# Patient Record
Sex: Female | Born: 1955 | Hispanic: Yes | Marital: Single | Smoking: Never smoker
Health system: Southern US, Community
[De-identification: ages and names within clinical notes are randomized; demographics above are authoritative.]

## PROBLEM LIST (undated history)

## (undated) DIAGNOSIS — I10 Essential (primary) hypertension: Secondary | ICD-10-CM

## (undated) DIAGNOSIS — M549 Dorsalgia, unspecified: Secondary | ICD-10-CM

## (undated) DIAGNOSIS — E7800 Pure hypercholesterolemia, unspecified: Secondary | ICD-10-CM

## (undated) DIAGNOSIS — M1990 Unspecified osteoarthritis, unspecified site: Secondary | ICD-10-CM

## (undated) HISTORY — PX: OOPHORECTOMY: SHX86

## (undated) HISTORY — PX: ABDOMINAL HYSTERECTOMY: SHX81

---

## 2005-11-29 ENCOUNTER — Ambulatory Visit: Payer: Self-pay | Admitting: Family Medicine

## 2007-10-14 ENCOUNTER — Ambulatory Visit: Payer: Self-pay

## 2010-03-14 ENCOUNTER — Ambulatory Visit: Payer: Self-pay

## 2013-01-06 LAB — COMPREHENSIVE METABOLIC PANEL
Albumin: 3.4 g/dL (ref 3.4–5.0)
Alkaline Phosphatase: 162 U/L — ABNORMAL HIGH (ref 50–136)
BUN: 22 mg/dL — ABNORMAL HIGH (ref 7–18)
Bilirubin,Total: 0.2 mg/dL (ref 0.2–1.0)
Co2: 27 mmol/L (ref 21–32)
Creatinine: 0.93 mg/dL (ref 0.60–1.30)
EGFR (African American): >60
SGOT(AST): 39 U/L — ABNORMAL HIGH (ref 15–37)

## 2013-01-06 LAB — URINALYSIS, COMPLETE
Bacteria: NONE SEEN
Bilirubin,UR: NEGATIVE
Glucose,UR: NEGATIVE mg/dL (ref 0–75)
Nitrite: NEGATIVE
Ph: 7 (ref 4.5–8.0)
Protein: NEGATIVE

## 2013-01-06 LAB — TROPONIN I: Troponin-I: <0.02 ng/mL

## 2013-01-06 LAB — CBC
HCT: 37.4 % (ref 35.0–47.0)
HGB: 12.7 g/dL (ref 12.0–16.0)
MCH: 30.2 pg (ref 26.0–34.0)

## 2013-01-07 ENCOUNTER — Observation Stay: Payer: Self-pay | Admitting: Internal Medicine

## 2013-01-07 LAB — LIPID PANEL
Cholesterol: 183 mg/dL (ref 0–200)
Ldl Cholesterol, Calc: 103 mg/dL — ABNORMAL HIGH (ref 0–100)
Triglycerides: 144 mg/dL (ref 0–200)
VLDL Cholesterol, Calc: 29 mg/dL (ref 5–40)

## 2013-01-07 LAB — CK TOTAL AND CKMB (NOT AT ARMC)
CK, Total: 143 U/L (ref 21–215)
CK, Total: 122 U/L (ref 21–215)
CK, Total: 110 U/L (ref 21–215)
CK-MB: 1.9 ng/mL (ref 0.5–3.6)
CK-MB: 1.3 ng/mL (ref 0.5–3.6)

## 2013-01-07 LAB — TROPONIN I
Troponin-I: <0.02 ng/mL
Troponin-I: <0.02 ng/mL

## 2013-01-07 LAB — HEMOGLOBIN A1C: Hemoglobin A1C: 5.9 % (ref 4.2–6.3)

## 2013-01-08 LAB — CBC WITH DIFFERENTIAL/PLATELET
Basophil #: 0 10*3/uL (ref 0.0–0.1)
Eosinophil #: 0.3 10*3/uL (ref 0.0–0.7)
HGB: 12.5 g/dL (ref 12.0–16.0)
MCH: 30.4 pg (ref 26.0–34.0)
MCV: 90 fL (ref 80–100)
Monocyte #: 0.7 x10 3/mm (ref 0.2–0.9)
Monocyte %: 8.2 %
Neutrophil #: 5.4 10*3/uL (ref 1.4–6.5)
Neutrophil %: 59.6 %
Platelet: 245 10*3/uL (ref 150–440)
RBC: 4.1 10*6/uL (ref 3.80–5.20)
RDW: 13.5 % (ref 11.5–14.5)
WBC: 9.1 10*3/uL (ref 3.6–11.0)

## 2013-01-08 LAB — BASIC METABOLIC PANEL
Calcium, Total: 8.7 mg/dL (ref 8.5–10.1)
Chloride: 110 mmol/L — ABNORMAL HIGH (ref 98–107)
Co2: 27 mmol/L (ref 21–32)
EGFR (African American): >60
Osmolality: 285 (ref 275–301)

## 2013-10-17 ENCOUNTER — Emergency Department: Payer: Self-pay | Admitting: Emergency Medicine

## 2013-10-17 LAB — COMPREHENSIVE METABOLIC PANEL
Albumin: 3.4 g/dL (ref 3.4–5.0)
Alkaline Phosphatase: 196 U/L — ABNORMAL HIGH
Anion Gap: 6 — ABNORMAL LOW (ref 7–16)
BUN: 21 mg/dL — ABNORMAL HIGH (ref 7–18)
Bilirubin,Total: 0.2 mg/dL (ref 0.2–1.0)
CHLORIDE: 103 mmol/L (ref 98–107)
CO2: 28 mmol/L (ref 21–32)
CREATININE: 0.81 mg/dL (ref 0.60–1.30)
Calcium, Total: 9.5 mg/dL (ref 8.5–10.1)
EGFR (African American): >60
EGFR (Non-African Amer.): >60
Glucose: 111 mg/dL — ABNORMAL HIGH (ref 65–99)
Osmolality: 277 (ref 275–301)
Potassium: 3.5 mmol/L (ref 3.5–5.1)
SGOT(AST): 52 U/L — ABNORMAL HIGH (ref 15–37)
SGPT (ALT): 48 U/L (ref 12–78)
Sodium: 137 mmol/L (ref 136–145)
TOTAL PROTEIN: 8.5 g/dL — AB (ref 6.4–8.2)

## 2013-10-17 LAB — CBC
HCT: 38.8 % (ref 35.0–47.0)
HGB: 13 g/dL (ref 12.0–16.0)
MCH: 30 pg (ref 26.0–34.0)
MCHC: 33.6 g/dL (ref 32.0–36.0)
MCV: 89 fL (ref 80–100)
Platelet: 245 10*3/uL (ref 150–440)
RBC: 4.34 10*6/uL (ref 3.80–5.20)
RDW: 13.2 % (ref 11.5–14.5)
WBC: 10 10*3/uL (ref 3.6–11.0)

## 2013-10-17 LAB — PROTIME-INR
INR: 0.9
Prothrombin Time: 12.5 secs (ref 11.5–14.7)

## 2013-10-17 LAB — TROPONIN I

## 2013-10-17 LAB — CK TOTAL AND CKMB (NOT AT ARMC)
CK, TOTAL: 193 U/L (ref 21–215)
CK-MB: 3.4 ng/mL (ref 0.5–3.6)

## 2014-05-11 ENCOUNTER — Ambulatory Visit: Payer: Self-pay

## 2014-06-29 ENCOUNTER — Emergency Department: Payer: Self-pay | Admitting: Emergency Medicine

## 2014-08-06 ENCOUNTER — Emergency Department: Payer: Self-pay | Admitting: Emergency Medicine

## 2015-01-06 NOTE — H&P (Signed)
PATIENT NAME:  Carmen Soto, Carmen Soto MR#:  [id number] DATE OF BIRTH:  [birth year]  DATE OF ADMISSION:  01/07/2013  REFERRING PHYSICIAN:  Dr. Bayard Males.  PRIMARY CARE PHYSICIAN:   [facility].   CHIEF COMPLAINT:  Multiple complaints including dizziness, headache, left upper extremity pain and minimal weakness and nausea.   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, the patient had CT brain in ED which did not show any acute finding, as well physical exam does not show any focal deficits besides her facial palsy, the patient reports she has been having left upper extremity pain, in the shoulder and in the neck area exacerbated by movement, as well some altered sensation there, as well has been complaining of unsteady gait and difficulty walking, hospitalist service were requested to admit the patient for further management and work-up of her symptoms, the patient reports she has history of hypertension and hyperlipidemia which are pretty controlled on her oral medications, reports she has been following regularly with Jeralyn Ruths, the patient denies any altered mental status, confusion, denies any history of falls, has been complaining of nausea, but denies any vomiting, no fever or chills.   PAST MEDICAL HISTORY: 1.  Hyperlipidemia.  2.  Hypertension.   PAST SURGICAL HISTORY: 1.  C-section x 3.  2.  Hysterectomy.  3.  Cholecystectomy.   ALLERGIES:  No known drug allergies.   HOME MEDICATIONS: 1.  Lipitor  20 mg oral at bedtime.  2.  Metoprolol 25 mg oral daily.  3.  Reports she has been taking aspirin 325 mg oral daily for arthritic pain.   FAMILY HISTORY:  Significant for CVA in the family, and hypertension, coronary artery disease, no history of diabetes mellitus.   SOCIAL HISTORY:  Works in packing, denies any history of smoking, alcohol or illicit drug use.   REVIEW OF SYSTEMS:  CONSTITUTIONAL:  Denies fever, chills.  Complains of generalized weakness.  EYES:  Denies blurry vision, double vision, inflammation or glaucoma.  EARS, NOSE, THROAT:  Denies tinnitus, ear pain, hearing loss or epistaxis.  RESPIRATORY:  Denies cough, wheezing, hemoptysis, dyspnea or COPD.  CARDIOVASCULAR:  Complains of chest pain with her other complaints, but currently she denies any chest pain.  No edema.  No palpitation, no syncope.  GASTROINTESTINAL:  Complains of nausea.  Denies vomiting, diarrhea, abdominal pain, hematemesis, jaundice, rectal bleed, or hemorrhoids.  GENITOURINARY:  Denies dysuria, hematuria, renal colic.  ENDOCRINE:  Denies polyuria, polydipsia, heat or cold intolerance.  HEMATOLOGY:  Denies anemia, easy bruising, bleeding diathesis.  INTEGUMENTARY:  Denies acne, rash or skin lesions.  MUSCULOSKELETAL:  Complains of neck pain, shoulder pain and arthritis.  Denies any gout.  NEUROLOGIC:  Denies CVA, TIA, seizures, memory loss, ataxia.  Has complaints of dizziness and headache.  PSYCHIATRIC:  Denies anxiety, insomnia, schizophrenia, or bipolar disorder.   PHYSICAL EXAMINATION: VITAL SIGNS:  Temperature 97.9, pulse 68, respiratory rate 18, blood pressure 124/77, saturating 95% on room air.  GENERAL:  Well-nourished female who looks comfortable in bed  in no apparent distress.  HEENT:  Head atraumatic, normocephalic.  Pupils equal, reactive to light.  Pink conjunctivae.  Anicteric sclerae.  Moist oral mucosa.  NECK:  Supple.  No thyromegaly.  No JVD.  CHEST:  Good air entry bilaterally.  No  wheezing, rales, rhonchi.  CARDIOVASCULAR:  S1, S2 heard.  No rubs, murmurs, or gallops.  ABDOMEN:  Soft, nontender, nondistended.  Bowel sounds present.  EXTREMITIES:  No edema.  No clubbing.  No cyanosis.  SKIN:  Normal skin turgor.  Warm and dry.  MUSCULOSKELETAL:  Has neck tenderness to palpation.  No joint effusion noticed.  NEUROLOGICAL:  The patient has left side facial Bell's palsy which is chronic for the last 20 years, other than that, no cranial focal deficits noticed, upper extremity motor strength 5 out of 5, sensation 5 out of 5.  Lower extremity sensation 5 out of 5, motor 5 out of 5.  Cerebellar exam fine.  Negative Romberg sign.  Normal gait.  Negative pronator drift.  PSYCHIATRIC:  Appropriate affect. Awake, alert x 3.  Intact judgment and insight.   PERTINENT LABORATORY DATA:  Glucose 147, BUN 22, creatinine 0.93, sodium 140, potassium 3.7, chloride 107, CO2 27.  Troponin less than 0.02.  White blood cells 9.6, hemoglobin 12.7, hematocrit 37.4, platelets 269.  CT of the head, no evidence of acute intracranial abnormalities.   ASSESSMENT AND PLAN: 1.  Dizziness, left upper extremity pain, generalized weakness, the patient had multiple complaints, but it appears most of her complaints appears to be due to cervical radiculopathy, has been pronounced is in the left upper extremity pain mainly upon ambulation, but given the fact patient has significant headache and complaints of unsteady gait even though it was noticed in ED, it was not noticed during my physical exam, the patient will be admitted for further evaluation.  We will obtain MRI of the brain, we will obtain bilateral carotid Dopplers and we will have the patient on telemonitor, she will be given 325 of aspirin now and will be continued on 81 mg of aspirin daily as we will check her lipid panel.  2.  Hypertension.  We will continue her on metoprolol, currently is acceptable.  3.  Hyperlipidemia.  Continue with Lipitor.  4.   Chest pain appears to be atypical, musculoskeletal, as she had reproducible chest pain on palpation, but we will continue to cycle her cardiac enzymes.  5.  Deep vein thrombosis prophylaxis.  Subcutaneous heparin.  6.  CODE STATUS:  FULL CODE.   Total time spent on admission and patient care 55 minutes.    ____________________________ Starleen Armsawood S. Elgergawy, MD dse:ea D: 01/07/2013 01:49:39 ET T: 01/07/2013 02:44:34 ET JOB#: [id number]  cc: Starleen Armsawood S. Elgergawy, MD, <Dictator> DAWOOD Teena IraniS ELGERGAWY MD ELECTRONICALLY SIGNED 01/10/2013 0:20

## 2015-01-06 NOTE — H&P (Signed)
PATIENT NAME:  Carmen Soto, Carmen Soto MR#:  [id number] DATE OF BIRTH:  09/24/1955  DATE OF ADMISSION:  01/06/2013  ADDENDUM:   The patient is Spanish speaking, and there was a Engineer, structuralpanish translator present during the whole interview and physical exam. The Spanish translator was [place].   ____________________________ Starleen Armsawood S. Nayanna Seaborn, MD dse:gb D: 01/07/2013 01:52:44 ET T: 01/07/2013 02:05:25 ET JOB#: [id number]  cc: Starleen Armsawood S. Iylah Dworkin, MD, <Dictator> Miran Kautzman Teena IraniS Wynnie Pacetti MD ELECTRONICALLY SIGNED 01/10/2013 0:19

## 2015-01-06 NOTE — Discharge Summary (Signed)
PATIENT NAME:  Carmen Soto, Carmen Soto MR#:  [id number] DATE OF BIRTH:  10-Jun-1956  DATE OF ADMISSION:  01/07/2013  ADMITTING PHYSICIAN:  Dr. Randol KernElgergawy  DATE OF DISCHARGE:  01/08/2013  DISCHARGING PHYSICIAN:  Dr. Enid Baasadhika Chun Sellen  PRIMARY CARE PHYSICIAN: [facility]  CONSULTATIONS IN THE HOSPITAL:  None.   DISCHARGE DIAGNOSES: 1.  Cervical spondylosis causing headaches and dizziness.  2.  Hypertension.  3.  Hyperlipidemia.   DISCHARGE MEDICATIONS: 1.  Meclizine 25 mg q. 8 hours p.r.n. for dizziness.  2.  Lipitor 20 mg p.o. daily at bedtime.  3.  Aspirin 81 mg p.o. daily.  4.  Metoprolol 25 mg p.o. daily.  5.  Flexeril 5 mg p.o. at bedtime.   DISCHARGE DIET:  Low sodium, low fat diet.   DISCHARGE ACTIVITY:  As tolerated.   FOLLOWUP INSTRUCTIONS:  1.  PCP followup in 1 to 2 weeks.  2.  Neck exercises for cervical spondylosis given.  LABS AND IMAGING STUDIES PRIOR TO DISCHARGE: WBC 9.1, hemoglobin 12.5, hematocrit 37.0, platelet count 245. Sodium 142, potassium 3.8, chloride 110, bicarb 27, BUN 19, creatinine 0.63, glucose 91, calcium of 8.7. Troponins remained negative while in the hospital. HbA1c is 5.9. LDL cholesterol 103, HDL 51, total cholesterol [id number], triglycerides 144. MRI of the brain without contrast showing no acute intracranial pathology. MRI of the C-spine without contrast showing some broad-based disk bulges, no neuroforaminal narrowing, cervical spondylosis is seen. Ultrasound Dopplers carotid bilaterally showing normal exam without any stenosis. Urine exam negative for any infection. CT of the head revealing no acute intracranial pathology. Chest x-ray:  Mild interstitial edema present. Small right-sided pleural effusion could be present.   BRIEF HOSPITAL COURSE:  The patient is a 59 year old Spanish-speaking female with a past medical history significant for hypertension and hyperlipidemia, comes to the hospital secondary to frequent onset of headache and  dizziness, worsened over the past 3 days.   1.  Dizziness and headache. Initially thought to be transient ischemic attack. MRI of the brain did not show any acute abnormality. The patient's dizziness was not associated with any particular position, mostly associated with headaches, and worsens especially in the morning. MRI of the C-spine revealed spondylosis, and also she is under a lot of stress, too, so muscle relaxant Flexeril was added at bedtime, neck exercises given, and meclizine p.r.n. for dizziness given. She was not extremely dizzy while in the hospital, ambulated well without any difficulty, so is being discharged home.   2.  Hypertension. Continue taking metoprolol at home.  3.  Hyperlipidemia. Patient on statin and her LDL is at 103, so continue statin at the current dose.   She is to follow up with PCP in the next 1 to 2 weeks. All the instructions and also questions answered using a Spanish interpreter while in the hospital. Course has been otherwise uneventful.   DISCHARGE CONDITION:  Stable.   DISCHARGE DISPOSITION:  Home.   Time spent on discharge:  45 minutes.    ____________________________ Enid Baasadhika Bee Marchiano, MD rk:mr D: 01/08/2013 15:42:00 ET T: 01/08/2013 22:34:33 ET JOB#: [id number]  cc: Enid Baasadhika Jasmia Angst, MD, <Dictator> Enid BaasADHIKA Waddell Iten MD ELECTRONICALLY SIGNED 01/14/2013 15:39

## 2015-09-04 ENCOUNTER — Ambulatory Visit: Payer: Self-pay

## 2015-09-06 ENCOUNTER — Ambulatory Visit: Payer: Self-pay | Attending: Oncology

## 2015-09-06 ENCOUNTER — Ambulatory Visit
Admission: RE | Admit: 2015-09-06 | Discharge: 2015-09-06 | Disposition: A | Discharge status: Home / Self Care | Payer: Self-pay | Source: Ambulatory Visit | Attending: Oncology | Admitting: Oncology

## 2015-09-06 VITALS — BP 136/87 | HR 66 | Temp 97.3°F | Resp 20 | Ht 63.2 in | Wt 150.5 lb

## 2015-09-06 DIAGNOSIS — Z0000 Encounter for general adult medical examination without abnormal findings: Secondary | ICD-10-CM

## 2015-09-06 NOTE — Progress Notes (Signed)
Subjective:     Patient ID: Carmen Soto, female   DOB: 04/11/1956, 59 y.o.   MRN: [id number]  HPI   Review of Systems     Objective:   Physical Exam  Pulmonary/Chest: Right breast exhibits no inverted nipple, no mass, no nipple discharge, no skin change and no tenderness. Left breast exhibits tenderness. Left breast exhibits no inverted nipple, no mass, no nipple discharge and no skin change. Breasts are symmetrical.  Tenderness left inner quadrant.  No mass or lump palpated.       Assessment:     59 year old patient presents for [facility] clinic visit. Patient screened, and meets B[facility]CP eligibility.  Patient does not have insurance, Medicare or Medicaid.  Handout given on Affordable Care Act.  CBE unremarkable.  Patient reports she has tenderness inner left breast at 9 o'clock. No mass or lump palpated.  Patient reports she drinks some coffee, and several Cokes a day.  Encouraged to limit caffeine intake to see if tenderness goes away.  She is to call back if tenderness remains or increases.  Instructed patient on breast self-exam using teach back method .   Patient states she did not have mammogram last year, just breast exam, but mammogram report in SCM has Birads 1 result.  Will send copy of today's mammogram results [facility] per patient request.    Plan:     Sent for bilateral screening mammogram.

## 2015-09-07 NOTE — Progress Notes (Signed)
Letter mailed from [facility] to notify of normal mammogram results.  Patient to return in one year for annual screening.  Copy to [facility]. 

## 2017-01-28 ENCOUNTER — Emergency Department: Payer: Self-pay | Admitting: Certified Registered Nurse Anesthetist

## 2017-01-28 ENCOUNTER — Emergency Department: Payer: Self-pay

## 2017-01-28 ENCOUNTER — Encounter: Payer: Self-pay | Admitting: Emergency Medicine

## 2017-01-28 ENCOUNTER — Encounter
Admission: EM | Disposition: A | Discharge status: Home / Self Care | Payer: Self-pay | Source: Home / Self Care | Attending: Emergency Medicine

## 2017-01-28 ENCOUNTER — Emergency Department
Admission: EM | Admit: 2017-01-28 | Discharge: 2017-01-28 | Disposition: A | Discharge status: Home / Self Care | Payer: Self-pay | Attending: Emergency Medicine | Admitting: Emergency Medicine

## 2017-01-28 DIAGNOSIS — M5032 Other cervical disc degeneration, mid-cervical region, unspecified level: Secondary | ICD-10-CM | POA: Insufficient documentation

## 2017-01-28 DIAGNOSIS — X58XXXA Exposure to other specified factors, initial encounter: Secondary | ICD-10-CM | POA: Insufficient documentation

## 2017-01-28 DIAGNOSIS — T18108A Unspecified foreign body in esophagus causing other injury, initial encounter: Secondary | ICD-10-CM

## 2017-01-28 DIAGNOSIS — E7800 Pure hypercholesterolemia, unspecified: Secondary | ICD-10-CM | POA: Insufficient documentation

## 2017-01-28 DIAGNOSIS — K115 Sialolithiasis: Secondary | ICD-10-CM | POA: Insufficient documentation

## 2017-01-28 DIAGNOSIS — T17228A Food in pharynx causing other injury, initial encounter: Secondary | ICD-10-CM | POA: Insufficient documentation

## 2017-01-28 DIAGNOSIS — F458 Other somatoform disorders: Secondary | ICD-10-CM | POA: Insufficient documentation

## 2017-01-28 DIAGNOSIS — Z79899 Other long term (current) drug therapy: Secondary | ICD-10-CM | POA: Insufficient documentation

## 2017-01-28 DIAGNOSIS — I700 Atherosclerosis of aorta: Secondary | ICD-10-CM | POA: Insufficient documentation

## 2017-01-28 DIAGNOSIS — R1310 Dysphagia, unspecified: Secondary | ICD-10-CM | POA: Insufficient documentation

## 2017-01-28 DIAGNOSIS — I10 Essential (primary) hypertension: Secondary | ICD-10-CM | POA: Insufficient documentation

## 2017-01-28 DIAGNOSIS — Z9889 Other specified postprocedural states: Secondary | ICD-10-CM | POA: Insufficient documentation

## 2017-01-28 DIAGNOSIS — T180XXA Foreign body in mouth, initial encounter: Secondary | ICD-10-CM | POA: Insufficient documentation

## 2017-01-28 DIAGNOSIS — M1990 Unspecified osteoarthritis, unspecified site: Secondary | ICD-10-CM | POA: Insufficient documentation

## 2017-01-28 HISTORY — PX: FOREIGN BODY REMOVAL ESOPHAGEAL: SHX5322

## 2017-01-28 HISTORY — DX: Unspecified osteoarthritis, unspecified site: M19.90

## 2017-01-28 HISTORY — DX: Essential (primary) hypertension: I10

## 2017-01-28 HISTORY — DX: Pure hypercholesterolemia, unspecified: E78.00

## 2017-01-28 SURGERY — REMOVAL, FOREIGN BODY, ESOPHAGUS
Anesthesia: General

## 2017-01-28 MED ORDER — LACTATED RINGERS IV SOLN
INTRAVENOUS | Status: DC | PRN
  Administered 2017-01-28: 18:00:00 via INTRAVENOUS

## 2017-01-28 MED ORDER — PROPOFOL 10 MG/ML IV BOLUS
INTRAVENOUS | Status: DC | PRN
  Administered 2017-01-28: 200 mg via INTRAVENOUS

## 2017-01-28 MED ORDER — FENTANYL CITRATE (PF) 100 MCG/2ML IJ SOLN
INTRAMUSCULAR | Status: DC | PRN
  Administered 2017-01-28: 25 ug via INTRAVENOUS

## 2017-01-28 MED ORDER — PROPOFOL 10 MG/ML IV BOLUS
INTRAVENOUS | Status: AC
  Filled 2017-01-28: qty 20

## 2017-01-28 MED ORDER — MIDAZOLAM HCL 2 MG/2ML IJ SOLN
INTRAMUSCULAR | Status: AC
  Filled 2017-01-28: qty 2

## 2017-01-28 MED ORDER — SUCCINYLCHOLINE CHLORIDE 20 MG/ML IJ SOLN
INTRAMUSCULAR | Status: DC | PRN
  Administered 2017-01-28: 120 mg via INTRAVENOUS

## 2017-01-28 MED ORDER — FENTANYL CITRATE (PF) 100 MCG/2ML IJ SOLN
INTRAMUSCULAR | Status: AC
  Filled 2017-01-28: qty 2

## 2017-01-28 MED ORDER — OXYCODONE HCL 5 MG PO TABS: 5.0000 mg | ORAL_TABLET | Freq: Once | ORAL | Status: DC | PRN

## 2017-01-28 MED ORDER — SUGAMMADEX SODIUM 200 MG/2ML IV SOLN
INTRAVENOUS | Status: DC | PRN
  Administered 2017-01-28: 150 mg via INTRAVENOUS

## 2017-01-28 MED ORDER — FENTANYL CITRATE (PF) 100 MCG/2ML IJ SOLN
25.0000 ug | INTRAMUSCULAR | Status: DC | PRN
  Administered 2017-01-28 (×3): 25 ug via INTRAVENOUS

## 2017-01-28 MED ORDER — ONDANSETRON HCL 4 MG/2ML IJ SOLN
INTRAMUSCULAR | Status: DC | PRN
  Administered 2017-01-28: 4 mg via INTRAVENOUS

## 2017-01-28 MED ORDER — OXYCODONE HCL 5 MG/5ML PO SOLN: 5.0000 mg | Freq: Once | ORAL | Status: DC | PRN

## 2017-01-28 MED ORDER — DEXAMETHASONE SODIUM PHOSPHATE 10 MG/ML IJ SOLN
INTRAMUSCULAR | Status: DC | PRN
  Administered 2017-01-28: 10 mg via INTRAVENOUS

## 2017-01-28 MED ORDER — SUGAMMADEX SODIUM 200 MG/2ML IV SOLN
INTRAVENOUS | Status: AC
  Filled 2017-01-28: qty 2

## 2017-01-28 MED ORDER — LIDOCAINE HCL (CARDIAC) 20 MG/ML IV SOLN
INTRAVENOUS | Status: DC | PRN
  Administered 2017-01-28: 60 mg via INTRAVENOUS

## 2017-01-28 MED ORDER — ONDANSETRON HCL 4 MG/2ML IJ SOLN
INTRAMUSCULAR | Status: AC
  Filled 2017-01-28: qty 2

## 2017-01-28 MED ORDER — LIDOCAINE HCL (PF) 2 % IJ SOLN
INTRAMUSCULAR | Status: AC
  Filled 2017-01-28: qty 2

## 2017-01-28 MED ORDER — MIDAZOLAM HCL 2 MG/2ML IJ SOLN
INTRAMUSCULAR | Status: DC | PRN
  Administered 2017-01-28: 1 mg via INTRAVENOUS

## 2017-01-28 MED ORDER — FENTANYL CITRATE (PF) 100 MCG/2ML IJ SOLN
INTRAMUSCULAR | Status: AC
  Administered 2017-01-28: 25 ug via INTRAVENOUS
  Filled 2017-01-28: qty 2

## 2017-01-28 MED ORDER — ROCURONIUM BROMIDE 100 MG/10ML IV SOLN
INTRAVENOUS | Status: DC | PRN
  Administered 2017-01-28: 10 mg via INTRAVENOUS

## 2017-01-28 SURGICAL SUPPLY — 19 items
CANISTER SUCT 1200ML W/VALVE (MISCELLANEOUS) ×3 IMPLANT
CUP MEDICINE 2OZ PLAST GRAD ST (MISCELLANEOUS) ×3 IMPLANT
DRAPE TABLE BACK 80X90 (DRAPES) ×3 IMPLANT
DRSG TELFA 4X3 1S NADH ST (GAUZE/BANDAGES/DRESSINGS) IMPLANT
GLOVE BIO SURGEON STRL SZ7.5 (GLOVE) ×3 IMPLANT
GOWN STRL REUS W/ TWL LRG LVL3 (GOWN DISPOSABLE) ×1 IMPLANT
GOWN STRL REUS W/TWL LRG LVL3 (GOWN DISPOSABLE) ×2
NDL SAFETY 18GX1.5 (NEEDLE) IMPLANT
NEEDLE FILTER BLUNT 18X 1/2SAF (NEEDLE)
NEEDLE FILTER BLUNT 18X1 1/2 (NEEDLE) IMPLANT
NEEDLE HYPO 25GX1X1/2 BEV (NEEDLE) IMPLANT
PATTIES SURGICAL .5 X.5 (GAUZE/BANDAGES/DRESSINGS) IMPLANT
SOL ANTI-FOG 6CC FOG-OUT (MISCELLANEOUS) IMPLANT
SOL FOG-OUT ANTI-FOG 6CC (MISCELLANEOUS)
SPONGE XRAY 4X4 16PLY STRL (MISCELLANEOUS) IMPLANT
SYRINGE 10CC LL (SYRINGE) IMPLANT
TOWEL OR 17X26 4PK STRL BLUE (TOWEL DISPOSABLE) IMPLANT
TUBING CONNECTING 10 (TUBING) ×2 IMPLANT
TUBING CONNECTING 10' (TUBING) ×1

## 2017-01-28 NOTE — Anesthesia Preprocedure Evaluation (Signed)
Anesthesia Evaluation  Patient identified by MRN, date of birth, ID band Patient awake    Reviewed: Allergy & Precautions, H&P , NPO status , Patient's Chart, lab work & pertinent test results  Airway Mallampati: III  TM Distance: <3 FB Neck ROM: limited    Dental  (+) Poor Dentition, Chipped, Missing, Upper Dentures, Lower Dentures   Pulmonary neg pulmonary ROS, neg shortness of breath,    Pulmonary exam normal breath sounds clear to auscultation       Cardiovascular Exercise Tolerance: Good hypertension, (-) angina(-) Past MI and (-) DOE negative cardio ROS Normal cardiovascular exam Rhythm:regular Rate:Normal     Neuro/Psych negative neurological ROS  negative psych ROS   GI/Hepatic negative GI ROS, Neg liver ROS, neg GERD  ,  Endo/Other  negative endocrine ROS  Renal/GU negative Renal ROS  negative genitourinary   Musculoskeletal  (+) Arthritis ,   Abdominal   Peds  Hematology negative hematology ROS (+)   Anesthesia Other Findings Past Medical History: No date: Arthritis No date: Hypercholesteremia No date: Hypertension  Past Surgical History: No date: CESAREAN SECTION  BMI    Body Mass Index:  25.70 kg/m      Reproductive/Obstetrics negative OB ROS                             Anesthesia Physical Anesthesia Plan  ASA: III and emergent  Anesthesia Plan: General   Post-op Pain Management:    Induction: Intravenous  Airway Management Planned: Natural Airway and Nasal Cannula  Additional Equipment:   Intra-op Plan:   Post-operative Plan:   Informed Consent: I have reviewed the patients History and Physical, chart, labs and discussed the procedure including the risks, benefits and alternatives for the proposed anesthesia with the patient or authorized representative who has indicated his/her understanding and acceptance.   Dental Advisory Given  Plan Discussed  with: Anesthesiologist, CRNA and Surgeon  Anesthesia Plan Comments: (Consent via interpreter.  Patient is over 7 hours NPO.  She reports no GERD.  Case is emergent per surgeon.  Discussion with surgeon.  Plan to induce and then let surgeon DL and remove fish bone.  Patient consented for risks of anesthesia including but not limited to:  - adverse reactions to medications - damage to teeth, lips or other oral mucosa - sore throat or hoarseness - Damage to heart, brain, lungs or loss of life  Patient voiced understanding.)        Anesthesia Quick Evaluation

## 2017-01-28 NOTE — Anesthesia Post-op Follow-up Note (Cosign Needed)
Anesthesia QCDR form completed.        

## 2017-01-28 NOTE — Anesthesia Postprocedure Evaluation (Signed)
Anesthesia Post Note  Patient: Aliscia San Morelleorres Silva  Procedure(s) Performed: Procedure(s) (LRB): REMOVAL FOREIGN BODY ESOPHAGEAL (N/A)  Patient location during evaluation: PACU Anesthesia Type: General Level of consciousness: awake and alert Pain management: pain level controlled Vital Signs Assessment: post-procedure vital signs reviewed and stable Respiratory status: spontaneous breathing, nonlabored ventilation, respiratory function stable and patient connected to nasal cannula oxygen Cardiovascular status: blood pressure returned to baseline and stable Postop Assessment: no signs of nausea or vomiting Anesthetic complications: no     Last Vitals:  Vitals:   01/28/17 1909 01/28/17 1923  BP: 139/88 (!) 146/80  Pulse: (!) 16 62  Resp: 16 16  Temp: 36.2 C     Last Pain:  Vitals:   01/28/17 1923  TempSrc:   PainSc: 3                  Cleda MccreedyJoseph K Rahn Lacuesta

## 2017-01-28 NOTE — ED Provider Notes (Signed)
[facility] Emergency Department Provider Note    First MD Initiated Contact with Patient 01/28/17 1520     (approximate)  I have reviewed the triage vital signs and the nursing notes.   HISTORY  Chief Complaint Swallowed Foreign Body   HPI Carmen Soto is a 61 y.o. female with below list of chronic medical conditions presents to the emergency department with history of having a fish bone stuck in her throat since 11:00 AM. Patient states that she's felt a persistent sensation of the fishbone in throat since that time.   Past Medical History:  Diagnosis Date  . Arthritis   . Hypercholesteremia   . Hypertension     There are no active problems to display for this patient.   Past Surgical History:  Procedure Laterality Date  . CESAREAN SECTION      Prior to Admission medications   Medication Sig Start Date End Date Taking? Authorizing Provider  atorvastatin (LIPITOR) 40 MG tablet Take 40 mg by mouth.   Yes [provider]  gabapentin (NEURONTIN) 600 MG tablet Take 600 mg by mouth.   Yes [provider]  lisinopril-hydrochlorothiazide (PRINZIDE,ZESTORETIC) 10-12.5 MG tablet Take 10-12.5 tablets by mouth.   Yes [provider]    Allergies Patient has no known allergies.  History reviewed. No pertinent family history.  Social History Social History  Substance Use Topics  . Smoking status: Never Smoker  . Smokeless tobacco: Never Used  . Alcohol use No    Review of Systems Constitutional: No fever/chills Eyes: No visual changes. ENT: No sore throat.Positive for fishbone in the throat. Cardiovascular: Denies chest pain. Respiratory: Denies shortness of breath. Gastrointestinal: No abdominal pain.  No nausea, no vomiting.  No diarrhea.  No constipation. Genitourinary: Negative for dysuria. Musculoskeletal: Negative for back pain. Integumentary: Negative for rash. Neurological: Negative for headaches,  focal weakness or numbness.   ____________________________________________   PHYSICAL EXAM:  VITAL SIGNS: ED Triage Vitals [01/28/17 1303]  Enc Vitals Group     BP (!) 146/87     Pulse Rate 66     Resp 18     Temp 98.9 F (37.2 C)     Temp Source Oral     SpO2 100 %     Weight 146 lb (66.2 kg)     Height      Head Circumference      Peak Flow      Pain Score 7     Pain Loc      Pain Edu?      Excl. in GC?     Constitutional: Alert and oriented. Well appearing and in no acute distress. Eyes: Conjunctivae are normal. Head: Atraumatic. Mouth/Throat: Mucous membranes are moist. Oropharynx non-erythematous. Neck: No stridor.   Cardiovascular: Normal rate, regular rhythm. Good peripheral circulation. Grossly normal heart sounds. Respiratory: Normal respiratory effort.  No retractions. Lungs CTAB. Gastrointestinal: Soft and nontender. No distention.  Musculoskeletal: No lower extremity tenderness nor edema. No gross deformities of extremities. Neurologic:  Normal speech and language. No gross focal neurologic deficits are appreciated.  Skin:  Skin is warm, dry and intact. No rash noted. Psychiatric: Mood and affect are normal. Speech and behavior are normal.  ____  RADIOLOGY I, [place] Sylvio Weatherall, personally viewed and evaluated these images (plain radiographs) as part of my medical decision making, as well as reviewing the written report by the radiologist. Dg Neck Soft Tissue  Result Date: 01/28/2017 CLINICAL DATA:  Possible aspiration  of fish bone EXAM: NECK SOFT TISSUES - 1+ VIEW COMPARISON:  None in PACs FINDINGS: The hypopharyngeal and laryngeal airway is well-expanded. There is a bony density that projects over the posterior aspect of the laryngeal cartilage which may reflect a foreign body. This is difficult to visualize with confidence on the frontal radiograph. The prevertebral soft tissue spaces are normal. There is degenerative disc disease of the mid and lower  cervical spine. There is mild loss of height of C5. There is facet joint hypertrophy. IMPRESSION: Possible fish bone projecting over the laryngeal cartilage on the lateral view which difficult to triangulate on the frontal view. Further evaluation with CT scanning or direct visualization would be useful. Electronically Signed   By: David  SwazilandJordan M.D.   On: 01/28/2017 13:42   Ct Soft Tissue Neck Wo Contrast  Result Date: 01/28/2017 CLINICAL DATA:  Globus sensation after eating fish 1-1/2 hours ago, assess for fish bone. Difficulty breathing, dysphagia. EXAM: CT NECK WITHOUT CONTRAST TECHNIQUE: Multidetector CT imaging of the neck was performed following the standard protocol without intravenous contrast. COMPARISON:  Neck radiograph Jan 28, 2017 at 1322 hours FINDINGS: PHARYNX AND LARYNX: 2 cm at linear radiopaque foreign body within the base of tongue/glossoepiglottic fold, effacing the vallecula (axial 42/110), not visible on today's radiograph. Radiographic abnormality corresponds to cricoarytenoid cartilage. Airway is patent. Normal larynx. SALIVARY GLANDS: Punctate LEFT parotid sialoliths with heterogeneous parotid glands, no acute parotiditis. Fatty infiltration submandibular glands. THYROID: Normal. LYMPH NODES: No lymphadenopathy by CT size criteria. VASCULAR: Mild calcific atherosclerosis of the aortic arch and carotid bifurcations. LIMITED INTRACRANIAL: Normal. VISUALIZED ORBITS: Normal. MASTOIDS AND VISUALIZED PARANASAL SINUSES: Well-aerated. SKELETON: Nonacute.  Severe LEFT C3-4 facet arthropathy. UPPER CHEST: 3 mm pulmonary nodule series 4, image 88/110. OTHER: None. IMPRESSION: 2 cm linear radiopaque foreign body at base of tongue most compatible with fish bone. Patent airway. LEFT parotid sialoliths. **An incidental finding of potential clinical significance has been found. 3 mm RIGHT upper lobe pulmonary nodule, no routine follow-up if low risk, optional CT at 12 months if high risk.**  Electronically Signed   By: Awilda Metroourtnay  Bloomer M.D.   On: 01/28/2017 16:53      Procedures   ____________________________________________   INITIAL IMPRESSION / ASSESSMENT AND PLAN / ED COURSE  Pertinent labs & imaging results that were available during my care of the patient were reviewed by me and considered in my medical decision making (see chart for details).  Given history of physical exam x-ray finds patient discussed with Dr. Jenne CampusMcQueen for endoscopy for foreign body removal from the esophagus. CT scan was performed which confirmed the presence of the esophageal foreign body. Dr. Jenne CampusMcQueen presented to the emergency department promptly to see the patient with plan to take the patient to the OR for soft and foreign body removal.      ____________________________________________  FINAL CLINICAL IMPRESSION(S) / ED DIAGNOSES  Final diagnoses:  Foreign body in esophagus, initial encounter     MEDICATIONS GIVEN DURING THIS VISIT:  Medications - No data to display   NEW OUTPATIENT MEDICATIONS STARTED DURING THIS VISIT:  New Prescriptions   No medications on file    Modified Medications   No medications on file    Discontinued Medications   HYDROCODONE-ACETAMINOPHEN (NORCO/VICODIN) 5-325 MG TABLET    Take 5-325 tablets by mouth.     Note:  This document was prepared using Dragon voice recognition software and may include unintentional dictation errors.    Darci CurrentBrown, [place] N, MD 01/28/17 [phone]

## 2017-01-28 NOTE — Op Note (Signed)
01/28/2017  6:21 PM    Carmen Soto, Carmen Soto  [id number]   Pre-Op Dx: foreign body  Post-op Dx: SAME  Proc: Direct laryngoscopy with removal of foreign body in the vallecula   Surg:  Lanissa Cashen T  Anes:  GOT  EBL:  Less than 5 cc  Comp:  None  Findings:  Fishbone embedded in the tongue base deep in the vallecula  Procedure: Carmen Soto was identified in the emergency room and taken emergently to the operating room. After general mask anesthesia a standard intubating laryngoscope was used to examine the vallecula I could not visualize the Fishbone and therefore an endotracheal tube was placed in the airway to secure the airway. With this completed a Dedo laryngoscope was reintroduced into the airway examination deep into the vallecula between the epiglottis and the tongue base the Fishbone could be identified. The grasping forceps were used to grab the Fishbone and remove it in its entirety. Reinspection with a Dedo in the vallecula and the larynx both piriform sinuses were clear of any other foreign body. The patient was in return anesthesia where she was awakened in the operating room taken recovery room stable condition.  Dispo:   Good  Plan:  Discharge to home follow-up 1 week recommend soft diet for 24 hours  [name] T  01/28/2017 6:21 PM

## 2017-01-28 NOTE — Discharge Instructions (Signed)
°     CIRUGIA AMBULATORIA       Instruccionnes de alta    Date Franco Nones(Fecha)  1315 de Mayo 2018   1.  Las drogas que se Dispensing opticianle administraron permaneceran en su cuerpo [employer]hasta [place], asi      que por las proximas 24 horas usted no debe:   Conducir Field seismologist(manejar) un automovil   Hacer ninguna decision legal   Tomar ninguna bebida alcoholica  2.  A) [place] puede comenzar una dieta regular.  Es mejor que hoy empiece con                    liquidos y gradualmente anada [address].       B) Puede comer cualquier comida que desee pero es mejor empezar con liquidos,               luego sopitas con galletas saladas y gradualmente llegar a las comidas solidas.  3.  Por favor avise a su medico inmediatamente si usted tiene algun sangrado anormal,       tiene dificultad con la respiracion, enrojecimiento y Engineer, miningdolor en el sitio de la cirugia,     Sawmilldrenaje, fiebro o dolor que se alivia con [facility].  4.  A) Su visita posoperatoria (despues de su operacion) es con el Dr. Jenne CampusMcQueen        B)  Por favor llame para hacer la cita posoperatoria.  5.  Istrucciones especificas :  Coma comida blanda. Harlon Ditty(Arroz, Pan, CofieldSopa)

## 2017-01-28 NOTE — Consult Note (Signed)
Carmen Soto, Kerrilynn [id number] 09/16/1955 Darci CurrentBrown, Curtice N, MD  Reason for Consult: Foreign body oropharynx  HPI: 61 year old female approximately 11:00 this morning was eating fish following which she felt the pain in the back of her throat since then she said difficulty swallowing and is able to handle her own secretions. Soft tissue lateral neck showed a question of foreign body CT obtained shows what appears be foreign body in the vallecula between the tongue base and the epiglottis. Her last food was approximately 11:00 this morning.  Allergies: No Known Allergies  ROS: Review of systems normal other than 12 systems except per HPI.  PMH:  Past Medical History:  Diagnosis Date  . Arthritis   . Hypercholesteremia   . Hypertension     FH: History reviewed. No pertinent family history.  SH:  Social History   Social History  . Marital status: Single    Spouse name: N/A  . Number of children: N/A  . Years of education: N/A   Occupational History  . Not on file.   Social History Main Topics  . Smoking status: Never Smoker  . Smokeless tobacco: Never Used  . Alcohol use No  . Drug use: No  . Sexual activity: Not on file   Other Topics Concern  . Not on file   Social History Narrative  . No narrative on file    PSH:  Past Surgical History:  Procedure Laterality Date  . CESAREAN SECTION      Physical  Exam: No apparent distress complaining of sore throat and feeling like something is caught. CN 2-12 grossly intact and symmetric. EAC/TMs normal BL. Oral cavity, lips, gums, ororpharynx normal with no masses or lesions. Skin warm and dry. Nasal cavity without polyps or purulence. External nose and ears without masses or lesions. EOMI, PERRLA. Neck supple with no masses or lesions. No lymphadenopathy palpated. Thyroid normal with no masses. Heart regular rate and rhythm lungs were clear to auscultation   CT scan neck probable foreign body in the vallecula between the  epiglottis and tongue base  A/P: Oropharyngeal foreign body with significant risk of aspiration we'll plan to take her emergently to the operating room for removal of foreign body she understands risk and benefits, and bleeding infection and airway obstruction and is eager to proceed a translator was with us throughout the discussions all questions were answered.   Davina PokeMCQUEEN,Elijha Dedman T 01/28/2017 5:36 PM

## 2017-01-28 NOTE — Anesthesia Procedure Notes (Signed)
Procedure Name: Intubation Date/Time: 01/28/2017 6:10 PM Performed by: Johnna Acosta Pre-anesthesia Checklist: Patient identified, Emergency Drugs available, Suction available, Patient being monitored and Timeout performed Patient Re-evaluated:Patient Re-evaluated prior to inductionOxygen Delivery Method: Circle system utilized Preoxygenation: Pre-oxygenation with 100% oxygen Intubation Type: Rapid sequence, Cricoid Pressure applied and IV induction Laryngoscope Size: Mac and 3 Grade View: Grade I Tube type: Oral Tube size: 7.0 mm Number of attempts: 1 Airway Equipment and Method: Stylet Placement Confirmation: ETT inserted through vocal cords under direct vision,  positive ETCO2 and breath sounds checked- equal and bilateral Secured at: 20 cm Tube secured with: Tape Dental Injury: Teeth and Oropharynx as per pre-operative assessment  Comments: Completed by  Dr Con Memos

## 2017-01-28 NOTE — ED Triage Notes (Signed)
Pt was eating fish about 1.5 hr ago and feels like bone in throat.  Feels like hard to get deep breath.  Pt also feels like hard to swallow.  handling secretions without difficultly and has been able to get water to pass. NAD at this time. Respirations unlabored.

## 2017-01-28 NOTE — Transfer of Care (Signed)
Immediate Anesthesia Transfer of Care Note  Patient: Carmen Soto  Procedure(s) Performed: Procedure(s): REMOVAL FOREIGN BODY ESOPHAGEAL (N/A)  Patient Location: PACU  Anesthesia Type:General  Level of Consciousness: sedated  Airway & Oxygen Therapy: Patient Spontanous Breathing and Patient connected to face mask oxygen  Post-op Assessment: Report given to RN and Post -op Vital signs reviewed and stable  Post vital signs: Reviewed and stable  Last Vitals:  Vitals:   01/28/17 1303 01/28/17 1744  BP: (!) 146/87 (!) 164/100  Pulse: 66 64  Resp: 18 16  Temp: 37.2 C 36.7 C    Last Pain:  Vitals:   01/28/17 1744  TempSrc: Oral  PainSc: 7          Complications: No apparent anesthesia complications

## 2017-01-28 NOTE — ED Notes (Signed)
Pt taken to OR after IV started and consent signed. Orderly at bedside to take pt. Pt in NAD at this time.

## 2017-01-29 ENCOUNTER — Encounter: Payer: Self-pay | Admitting: Unknown Physician Specialty

## 2018-12-16 ENCOUNTER — Ambulatory Visit: Payer: Self-pay

## 2019-05-03 ENCOUNTER — Other Ambulatory Visit: Payer: Self-pay

## 2019-05-04 ENCOUNTER — Encounter: Payer: Self-pay | Admitting: *Deleted

## 2019-05-04 ENCOUNTER — Ambulatory Visit
Admission: RE | Admit: 2019-05-04 | Discharge: 2019-05-04 | Disposition: A | Discharge status: Home / Self Care | Payer: Self-pay | Source: Ambulatory Visit | Attending: Oncology | Admitting: Oncology

## 2019-05-04 ENCOUNTER — Ambulatory Visit: Payer: Self-pay | Attending: Oncology | Admitting: *Deleted

## 2019-05-04 ENCOUNTER — Other Ambulatory Visit: Payer: Self-pay

## 2019-05-04 VITALS — BP 130/76 | HR 63 | Temp 97.8°F | Ht 63.0 in | Wt 148.0 lb

## 2019-05-04 DIAGNOSIS — Z0000 Encounter for general adult medical examination without abnormal findings: Secondary | ICD-10-CM | POA: Insufficient documentation

## 2019-05-04 NOTE — Progress Notes (Signed)
  Subjective:     Patient ID: Carmen Soto, female   DOB: 04/05/1956, 63 y.o.   MRN: [id number]  HPI   Review of Systems     Objective:   Physical Exam Chest:     Breasts:        Right: No swelling, bleeding, inverted nipple, mass, nipple discharge, skin change or tenderness.        Left: No swelling, bleeding, inverted nipple, mass, nipple discharge, skin change or tenderness.    Lymphadenopathy:     Upper Body:     Right upper body: No supraclavicular or axillary adenopathy.     Left upper body: No supraclavicular or axillary adenopathy.        Assessment:     63 year old Hispanic female returns to [facility] for annual screening.  Lloyda, the interpreter present during the interview and exam.  Patient complains of "chronic" back and lateral pain for greater than 10 years.  States her primary care provider, Dr. Ladoris Gene and her rheumatologist are aware of her pain.  On clinical breast exam there is no dominant mass, skin changes, lymphadenopathy or nipple discharge.  I can palpate tender fatty glandular like tissue past the axillary line bilaterally.  Not sure if this is breast tissue or fatty tissue.  Taught self breast awareness.  Patient has a history of hysterectomy.  No pap needed per protocol.  Patient has been screened for eligibility.  She does not have any insurance, Medicare or Medicaid.  She also meets financial eligibility.  Hand-out given on the Affordable Care Act.  Risk Assessment    Risk Scores      05/04/2019   Last edited by: Theodore Demark, RN   5-year risk: 0.7 %   Lifetime risk: 3 %            Plan:     Screening mammogram ordered.  She is to follow up with Dr. Ladoris Gene in regards to the back and lateral chest pain.  She is agreeable.

## 2019-05-07 ENCOUNTER — Encounter: Payer: Self-pay | Admitting: *Deleted

## 2019-05-07 NOTE — Progress Notes (Signed)
Letter mailed from the [facility] to inform patient of her normal mammogram results.  Patient is to follow-up with annual screening in one year.  HSIS to Christy. 

## 2019-10-16 ENCOUNTER — Emergency Department: Payer: Self-pay

## 2019-10-16 ENCOUNTER — Emergency Department
Admission: EM | Admit: 2019-10-16 | Discharge: 2019-10-16 | Disposition: A | Discharge status: Home / Self Care | Payer: Self-pay | Attending: Emergency Medicine | Admitting: Emergency Medicine

## 2019-10-16 ENCOUNTER — Other Ambulatory Visit: Payer: Self-pay

## 2019-10-16 DIAGNOSIS — G43819 Other migraine, intractable, without status migrainosus: Secondary | ICD-10-CM | POA: Insufficient documentation

## 2019-10-16 DIAGNOSIS — Z79899 Other long term (current) drug therapy: Secondary | ICD-10-CM | POA: Insufficient documentation

## 2019-10-16 DIAGNOSIS — I10 Essential (primary) hypertension: Secondary | ICD-10-CM | POA: Insufficient documentation

## 2019-10-16 DIAGNOSIS — R109 Unspecified abdominal pain: Secondary | ICD-10-CM

## 2019-10-16 LAB — CBC
HCT: 40 % (ref 36.0–46.0)
Hemoglobin: 13.1 g/dL (ref 12.0–15.0)
MCH: 29.6 pg (ref 26.0–34.0)
MCHC: 32.8 g/dL (ref 30.0–36.0)
MCV: 90.3 fL (ref 80.0–100.0)
Platelets: 296 10*3/uL (ref 150–400)
RBC: 4.43 MIL/uL (ref 3.87–5.11)
RDW: 12.7 % (ref 11.5–15.5)
WBC: 8.5 10*3/uL (ref 4.0–10.5)
nRBC: 0 % (ref 0.0–0.2)

## 2019-10-16 LAB — COMPREHENSIVE METABOLIC PANEL
ALT: 36 U/L (ref 0–44)
AST: 37 U/L (ref 15–41)
Albumin: 4 g/dL (ref 3.5–5.0)
Alkaline Phosphatase: 180 U/L — ABNORMAL HIGH (ref 38–126)
Anion gap: 10 (ref 5–15)
BUN: 20 mg/dL (ref 8–23)
CO2: 25 mmol/L (ref 22–32)
Calcium: 9.3 mg/dL (ref 8.9–10.3)
Chloride: 105 mmol/L (ref 98–111)
Creatinine, Ser: 0.56 mg/dL (ref 0.44–1.00)
GFR calc Af Amer: >60 mL/min (ref >60)
GFR calc non Af Amer: >60 mL/min (ref >60)
Glucose, Bld: 107 mg/dL — ABNORMAL HIGH (ref 70–99)
Potassium: 3.6 mmol/L (ref 3.5–5.1)
Sodium: 140 mmol/L (ref 135–145)
Total Bilirubin: 0.5 mg/dL (ref 0.3–1.2)
Total Protein: 8.4 g/dL — ABNORMAL HIGH (ref 6.5–8.1)

## 2019-10-16 LAB — URINALYSIS, COMPLETE (UACMP) WITH MICROSCOPIC
Bacteria, UA: NONE SEEN
Bilirubin Urine: NEGATIVE
Glucose, UA: NEGATIVE mg/dL
Hgb urine dipstick: NEGATIVE
Ketones, ur: NEGATIVE mg/dL
Leukocytes,Ua: NEGATIVE
Nitrite: NEGATIVE
Protein, ur: NEGATIVE mg/dL
Specific Gravity, Urine: 1.005 (ref 1.005–1.030)
pH: 7 (ref 5.0–8.0)

## 2019-10-16 LAB — LIPASE, BLOOD: Lipase: 29 U/L (ref 11–51)

## 2019-10-16 MED ORDER — ACETAMINOPHEN 325 MG PO TABS
650.0000 mg | ORAL_TABLET | Freq: Once | ORAL | Status: AC
  Administered 2019-10-16: 02:00:00 650 mg via ORAL
  Filled 2019-10-16: qty 2

## 2019-10-16 MED ORDER — KETOROLAC TROMETHAMINE 30 MG/ML IJ SOLN
30.0000 mg | Freq: Once | INTRAMUSCULAR | Status: AC
  Administered 2019-10-16: 30 mg via INTRAVENOUS
  Filled 2019-10-16: qty 1

## 2019-10-16 MED ORDER — IOHEXOL 300 MG/ML  SOLN
100.0000 mL | Freq: Once | INTRAMUSCULAR | Status: AC | PRN
  Administered 2019-10-16: 03:00:00 100 mL via INTRAVENOUS

## 2019-10-16 MED ORDER — METOCLOPRAMIDE HCL 5 MG/ML IJ SOLN
10.0000 mg | Freq: Once | INTRAMUSCULAR | Status: AC
  Administered 2019-10-16: 02:00:00 10 mg via INTRAVENOUS
  Filled 2019-10-16: qty 2

## 2019-10-16 MED ORDER — DIPHENHYDRAMINE HCL 50 MG/ML IJ SOLN
12.5000 mg | Freq: Once | INTRAMUSCULAR | Status: AC
  Administered 2019-10-16: 12.5 mg via INTRAVENOUS
  Filled 2019-10-16: qty 1

## 2019-10-16 MED ORDER — ONDANSETRON HCL 4 MG/2ML IJ SOLN
4.0000 mg | Freq: Once | INTRAMUSCULAR | Status: AC
  Administered 2019-10-16: 05:00:00 4 mg via INTRAVENOUS
  Filled 2019-10-16: qty 2

## 2019-10-16 NOTE — ED Notes (Addendum)
Assessment completed using interepretor #[id number] Md at bedside.

## 2019-10-16 NOTE — ED Notes (Signed)
Pt taken to CT.

## 2019-10-16 NOTE — ED Triage Notes (Signed)
Info obtained using [employer], [place] [phone].  Patient reports blood pressure elevated for 1 week.  Today with abdominal pain and nausea.

## 2019-10-16 NOTE — ED Provider Notes (Signed)
[facility] Emergency Department Provider Note  ____________________________________________   First MD Initiated Contact with Patient 10/16/19 0150     (approximate)  I have reviewed the triage vital signs and the nursing notes.   HISTORY  Chief Complaint Abdominal Pain    HPI Carmen Soto is a 64 y.o. female with hypertension who comes in for abdominal pain and headaches.  Patient stated that she has noticed over the past few days her blood pressures have been elevated in the 200s.  She stated that about 4-5 hours ago her blood pressure was in the 200s.  She is on lisinopril 40 mg and she took an extra pill.  Currently her blood pressures are in the 160s.  She states that she started having a headache that is been gradually worsening but getting more severe as well as some upper abdominal pain.  These pains have been constant, nothing makes it better, nothing makes them worse.  She does state that she has had headaches in the past related to her blood pressure.  Spanish interpreter was used for her history and physical exam.          Past Medical History:  Diagnosis Date  . Arthritis   . Hypercholesteremia   . Hypertension     There are no problems to display for this patient.   Past Surgical History:  Procedure Laterality Date  . ABDOMINAL HYSTERECTOMY    . CESAREAN SECTION    . FOREIGN BODY REMOVAL ESOPHAGEAL N/A 01/28/2017   Procedure: REMOVAL FOREIGN BODY ESOPHAGEAL;  Surgeon: Linus Salmons, MD;  Location: [facility] ORS;  Service: ENT;  Laterality: N/A;  . OOPHORECTOMY      Prior to Admission medications   Medication Sig Start Date End Date Taking? Authorizing Provider  atorvastatin (LIPITOR) 40 MG tablet Take 40 mg by mouth.    [provider]  gabapentin (NEURONTIN) 600 MG tablet Take 600 mg by mouth.    [provider]  lisinopril-hydrochlorothiazide (PRINZIDE,ZESTORETIC) 10-12.5 MG tablet Take 10-12.5 tablets  by mouth.    [provider]    Allergies Patient has no known allergies.  No family history on file.  Social History Social History   Tobacco Use  . Smoking status: Never Smoker  . Smokeless tobacco: Never Used  Substance Use Topics  . Alcohol use: No  . Drug use: No      Review of Systems Constitutional: No fever/chills Eyes: No visual changes. ENT: No sore throat. Cardiovascular: Denies chest pain. Respiratory: Denies shortness of breath. Gastrointestinal: Positive abdominal pain no nausea, no vomiting.  No diarrhea.  No constipation. Genitourinary: Negative for dysuria. Musculoskeletal: Negative for back pain. Skin: Negative for rash. Neurological: Positive headache, no focal weakness or numbness. All other ROS negative ____________________________________________   PHYSICAL EXAM:  VITAL SIGNS: ED Triage Vitals  Enc Vitals Group     BP 10/16/19 0127 (!) 213/84     Pulse Rate 10/16/19 0127 71     Resp 10/16/19 0127 17     Temp 10/16/19 0127 97.9 F (36.6 C)     Temp Source 10/16/19 0127 Oral     SpO2 10/16/19 0127 100 %     Weight 10/16/19 0128 160 lb (72.6 kg)     Height 10/16/19 0128 5\' 3"  (1.6 m)     Head Circumference --      Peak Flow --      Pain Score 10/16/19 0128 4     Pain Loc --  Pain Edu? --      Excl. in GC? --     Constitutional: Alert and oriented. Well appearing and in no acute distress. Eyes: Conjunctivae are normal. EOMI. Head: Atraumatic. Nose: No congestion/rhinnorhea. Mouth/Throat: Mucous membranes are moist.   Neck: No stridor. Trachea Midline. FROM Cardiovascular: Normal rate, regular rhythm. Grossly normal heart sounds.  Good peripheral circulation. Respiratory: Normal respiratory effort.  No retractions. Lungs CTAB. Gastrointestinal: Upper abdominal tenderness.  No distention. No abdominal bruits.  Musculoskeletal: No lower extremity tenderness nor edema.  No joint effusions. Neurologic:  Normal speech and  language. No gross focal neurologic deficits are appreciated.  Skin:  Skin is warm, dry and intact. No rash noted. Psychiatric: Mood and affect are normal. Speech and behavior are normal. GU: Deferred   ____________________________________________   LABS (all labs ordered are listed, but only abnormal results are displayed)  Labs Reviewed  COMPREHENSIVE METABOLIC PANEL - Abnormal; Notable for the following components:      Result Value   Glucose, Bld 107 (*)    Total Protein 8.4 (*)    Alkaline Phosphatase 180 (*)    All other components within normal limits  URINALYSIS, COMPLETE (UACMP) WITH MICROSCOPIC - Abnormal; Notable for the following components:   Color, Urine COLORLESS (*)    APPearance CLEAR (*)    All other components within normal limits  LIPASE, BLOOD  CBC   ____________________________________________   ED ECG REPORT I, Concha Se, the attending physician, personally viewed and interpreted this ECG.  EKG is normal sinus rate of 68, no ST elevation, no T wave inversions, normal intervals ____________________________________________  RADIOLOGY  Official radiology report(s): CT Head Wo Contrast  Result Date: 10/16/2019 CLINICAL DATA:  Headaches and elevated blood pressure. EXAM: CT HEAD WITHOUT CONTRAST TECHNIQUE: Contiguous axial images were obtained from the base of the skull through the vertex without intravenous contrast. COMPARISON:  Head CT 10/17/2013 FINDINGS: Brain: No intracranial hemorrhage, mass effect, or midline shift. Brain volume is normal for age. No hydrocephalus. The basilar cisterns are patent. No evidence of territorial infarct or acute ischemia. No extra-axial or intracranial fluid collection. Vascular: No hyperdense vessel or unexpected calcification. Skull: No fracture or focal lesion. Sinuses/Orbits: Paranasal sinuses and mastoid air cells are clear. The visualized orbits are unremarkable. Other: None. IMPRESSION: No acute intracranial  abnormality. Electronically Signed   By: Narda Rutherford M.D.   On: 10/16/2019 03:43   CT ABDOMEN PELVIS W CONTRAST  Result Date: 10/16/2019 CLINICAL DATA:  Abdominal pain and nausea. EXAM: CT ABDOMEN AND PELVIS WITH CONTRAST TECHNIQUE: Multidetector CT imaging of the abdomen and pelvis was performed using the standard protocol following bolus administration of intravenous contrast. CONTRAST:  OMNIPAQUE IOHEXOL 300 MG/ML  SOLN COMPARISON:  None. FINDINGS: Lower chest: 4 mm perifissural nodule in the right middle lobe, series 4, image 10. Punctate nodule in the left lower lobe, series 4, image 11. Heart is normal in size. No pleural effusion. Hepatobiliary: No focal liver abnormality is seen. Status post cholecystectomy. No biliary dilatation. Common bile duct measures 8 mm, normal for postcholecystectomy status. Pancreas: No ductal dilatation or inflammation. Spleen: Normal in size without focal abnormality. Adrenals/Urinary Tract: Normal adrenal glands. No hydronephrosis or perinephric edema. Homogeneous renal enhancement with symmetric excretion on delayed phase imaging. Urinary bladder is physiologically distended without wall thickening. Stomach/Bowel: Stomach physiologically distended. No small bowel obstruction or inflammation. Mild fecalization of distal small bowel contents. Appendix not confidently visualized. No evidence of appendicitis. Colonic diverticulosis, most prominent  in the sigmoid colon. No diverticulitis. Slight rectus diastasis with fat containing lower ventral abdominal wall hernia. Nonobstructed noninflamed small and large bowel extend into the abdominal wall defect. Vascular/Lymphatic: Abdominal aorta is normal in caliber. No aortic aneurysm. Portal vein is patent. Mesenteric vessels are patent. No adenopathy. Reproductive: Status post hysterectomy. No adnexal masses. Other: No free air, free fluid, or intra-abdominal fluid collection. Rectus diastasis with small fat containing  lower ventral abdominal wall hernia, mild herniation of large and small bowel into the hernia. Musculoskeletal: There are no acute or suspicious osseous abnormalities. Degenerative change of the right sacroiliac joint. Degenerative disc disease at L5-S1. IMPRESSION: 1. No acute abnormality in the abdomen/pelvis. 2. Colonic diverticulosis without diverticulitis. 3. Fecalization of distal small bowel contents can be seen with slow transit. No bowel obstruction or inflammation. 4. Rectus diastasis with lower ventral abdominal wall hernia containing nonobstructed noninflamed large and small bowel. 5. Pulmonary nodules in the lung bases, largest measuring 4 mm. No follow-up needed if patient is low-risk (and has no known or suspected primary neoplasm). Non-contrast chest CT can be considered in 12 months if patient is high-risk. This recommendation follows the consensus statement: Guidelines for Management of Incidental Pulmonary Nodules Detected on CT Images: From the Fleischner Society 2017; Radiology 2017; 284:228-243. Electronically Signed   By: Narda Rutherford M.D.   On: 10/16/2019 03:52    ____________________________________________   PROCEDURES  Procedure(s) performed (including Critical Care):  Procedures   ____________________________________________   INITIAL IMPRESSION / ASSESSMENT AND PLAN / ED COURSE  Carmen Soto was evaluated in Emergency Department on 10/16/2019 for the symptoms described in the history of present illness. She was evaluated in the context of the global COVID-19 pandemic, which necessitated consideration that the patient might be at risk for infection with the SARS-CoV-2 virus that causes COVID-19. Institutional protocols and algorithms that pertain to the evaluation of patients at risk for COVID-19 are in a state of rapid change based on information released by regulatory bodies including the CDC and federal and state organizations. These policies and algorithms  were followed during the patient's care in the ED.    Patient is a well-appearing 64 year old who comes in with high blood pressures and concerns for headache and abdominal pain.  Will give migraine cocktail.  Given patient report of elevated blood pressure in the 200s earlier will get CT head to make sure is no evidence of intra or cranial hemorrhage.  Does not sound like a subarachnoid hemorrhage given not severe and thunderclap in onset.  Will get CT abdomen given palpable epigastric tenderness to evaluate for signs of acute pathology such as perforation, obstruction given the nausea and abdominal pain.  Will get labs evaluate for electrolyte abnormalities, AKI.  I have low suspicion for ACS at this time given her pain is reproducible on her abdomen and she is having no chest pain or shortness of breath.  Will get EKG but otherwise low suspicion.  No shortness of breath to suggest PE.  Low suspicion for dissection.  Labs are reassuring.  CT head is negative  CT scan shows the patient is post cholecystectomy.  She does have a incidental pulmonary nodule which I discussed with patient.  I did provide a report of this in her discharge instructions  Reevaluated patient and her symptoms are much improved.  Blood pressures have come down as we have treated her headache.  Suspect that her elevated blood pressures was secondary to the pain from her migraine.  Patient  can follow-up with her primary care doctor for blood pressure recheck on Monday.  We discussed not changing her medications at this time given her blood pressure has normalized.      ____________________________________________   FINAL CLINICAL IMPRESSION(S) / ED DIAGNOSES   Final diagnoses:  Other migraine without status migrainosus, intractable  Abdominal pain, unspecified abdominal location  Hypertension, unspecified type      MEDICATIONS GIVEN DURING THIS VISIT:  Medications  metoCLOPramide (REGLAN) injection 10 mg (10 mg  Intravenous Given 10/16/19 0228)  diphenhydrAMINE (BENADRYL) injection 12.5 mg (12.5 mg Intravenous Given 10/16/19 0228)  acetaminophen (TYLENOL) tablet 650 mg (650 mg Oral Given 10/16/19 0227)  iohexol (OMNIPAQUE) 300 MG/ML solution 100 mL (100 mLs Intravenous Contrast Given 10/16/19 0313)  ketorolac (TORADOL) 30 MG/ML injection 30 mg (30 mg Intravenous Given 10/16/19 0457)  ondansetron (ZOFRAN) injection 4 mg (4 mg Intravenous Given 10/16/19 0458)     ED Discharge Orders    None       Note:  This document was prepared using Dragon voice recognition software and may include unintentional dictation errors.   Vanessa Mer Rouge, MD 10/16/19 [phone]

## 2019-10-16 NOTE — Discharge Instructions (Addendum)
Your labs are reassuring.  You should follow-up with your primary care doctor on Monday for blood pressure recheck.  Return to the ER for any other concerns    IMPRESSION:  1. No acute abnormality in the abdomen/pelvis.  2. Colonic diverticulosis without diverticulitis.  3. Fecalization of distal small bowel contents can be seen with slow  transit. No bowel obstruction or inflammation.  4. Rectus diastasis with lower ventral abdominal wall hernia  containing nonobstructed noninflamed large and small bowel.  5. Pulmonary nodules in the lung bases, largest measuring 4 mm. No  follow-up needed if patient is low-risk (and has no known or  suspected primary neoplasm). Non-contrast chest CT can be considered  in 12 months if patient is high-risk. This recommendation follows  the consensus statement: Guidelines for Management of Incidental  Pulmonary Nodules Detected on CT Images: From the Fleischner Society  2017; Radiology 2017; 284:228-243.

## 2020-07-10 ENCOUNTER — Other Ambulatory Visit: Payer: Self-pay | Admitting: Family Medicine

## 2021-09-06 IMAGING — CT CT HEAD W/O CM
3 series · 16 of 45 positions shown, 19 images · non-contrast
Comparison: Head CT 10/17/2013

CLINICAL DATA: Headaches and elevated blood pressure.

EXAM:
CT HEAD WITHOUT CONTRAST
TECHNIQUE: Contiguous axial images were obtained from the base of the skull
through the vertex without intravenous contrast.

[Series 3: head wo · axial · 0.40mm/px · z∈[-160,-45]mm · 10 of 28 slices shown, 13 images]
[im 3/28  brain]
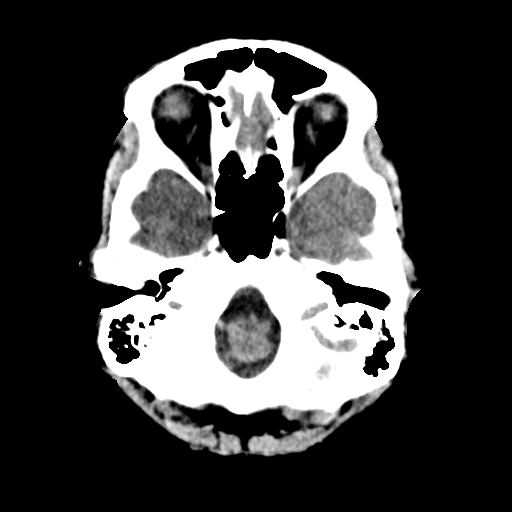
[im 3/28  bone]
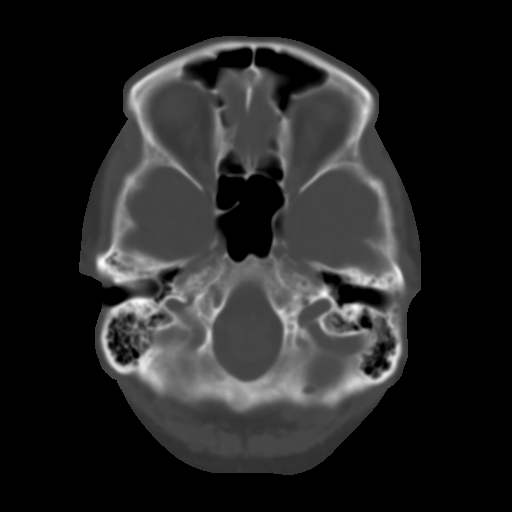
[im 5/28  brain]
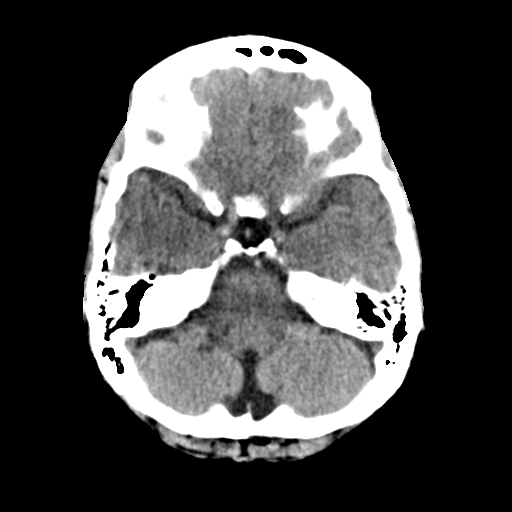
[im 8/28  brain]
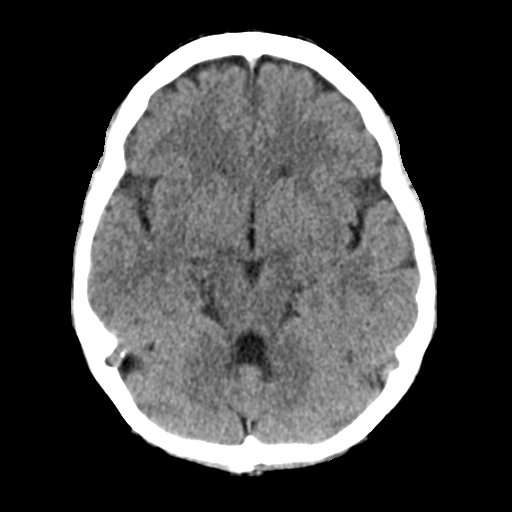
[im 11/28  brain]
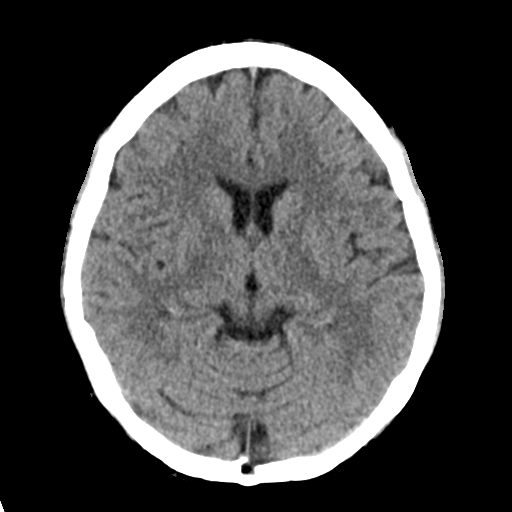
[im 13/28  brain]
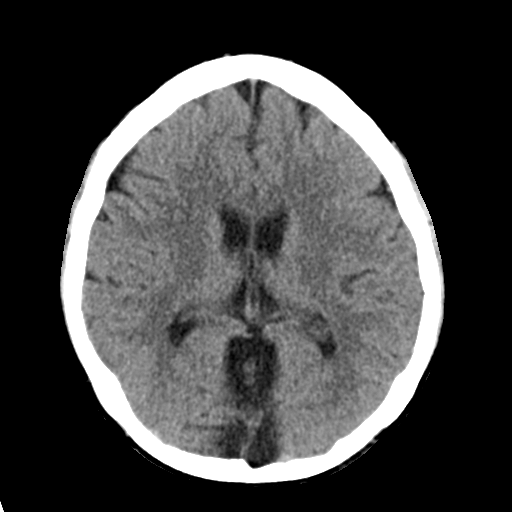
[im 13/28  bone]
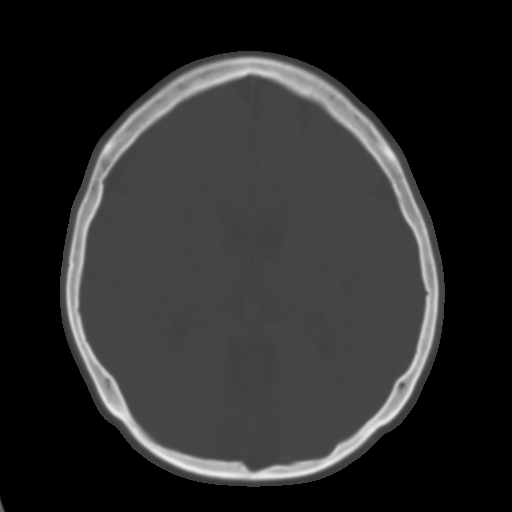
[im 16/28  brain]
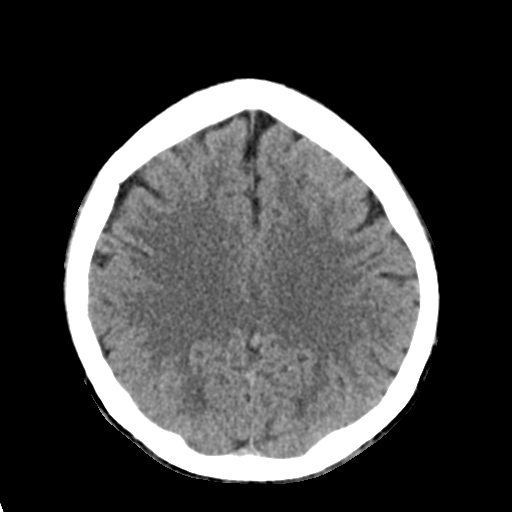
[im 18/28  brain]
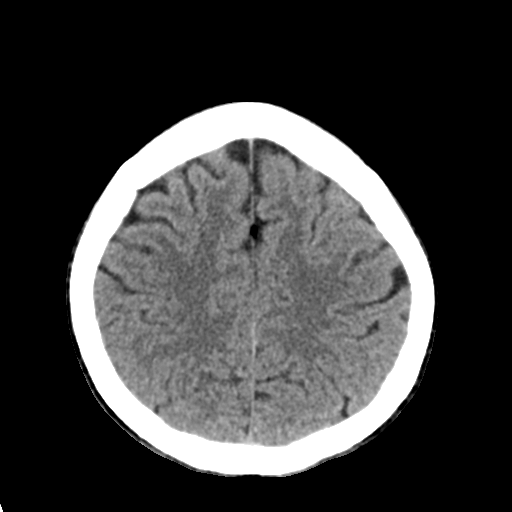
[im 21/28  brain]
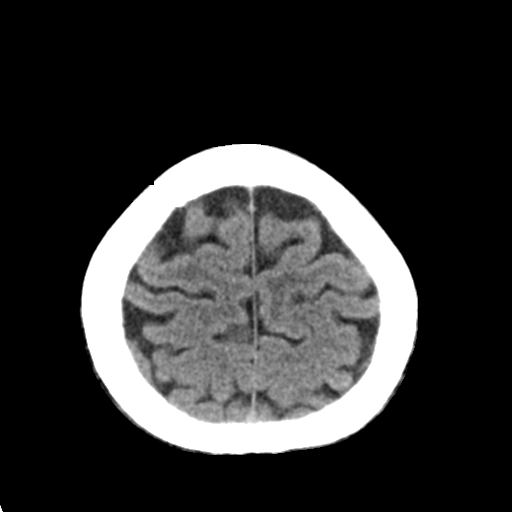
[im 24/28  brain]
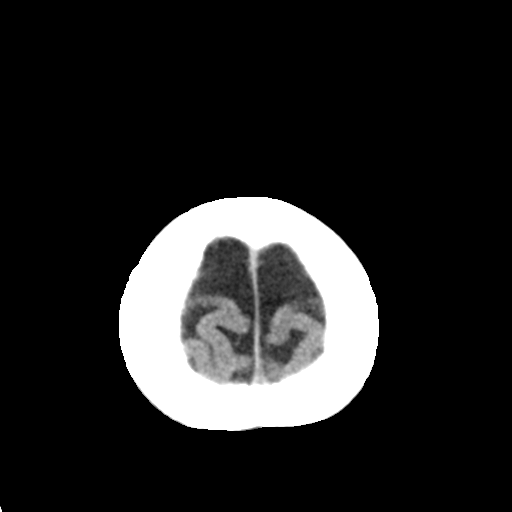
[im 24/28  bone]
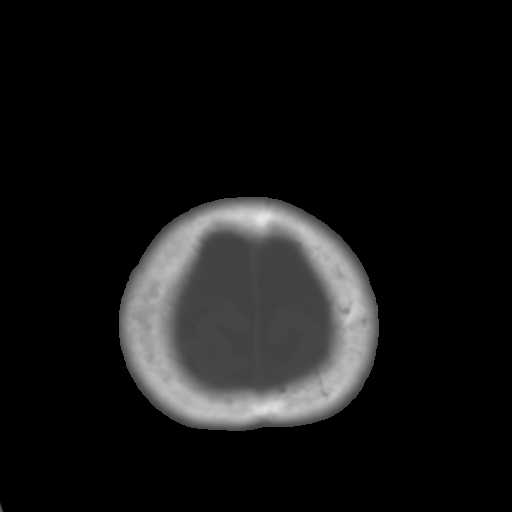
[im 26/28  brain]
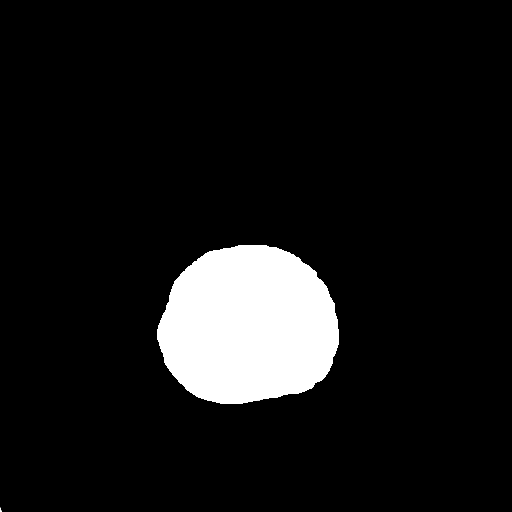

[Series 4: coronal soft tissue · coronal · 0.29mm/px · 3 of 59 slices shown]
[im 20/59  brain]
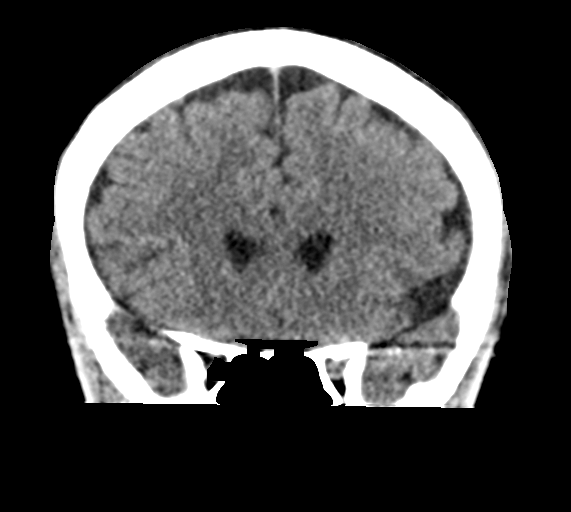
[im 26/59  brain]
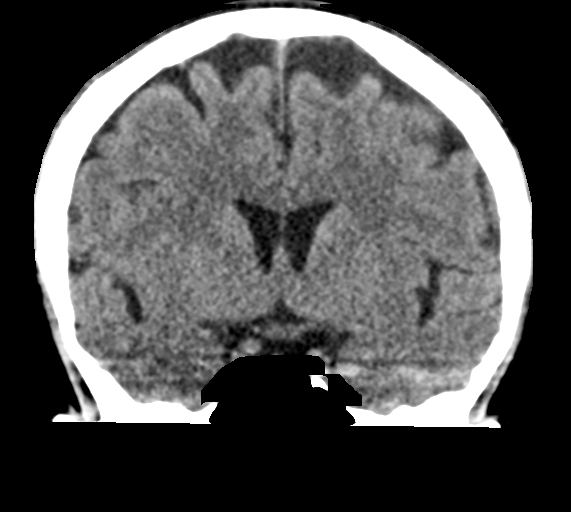
[im 33/59  brain]
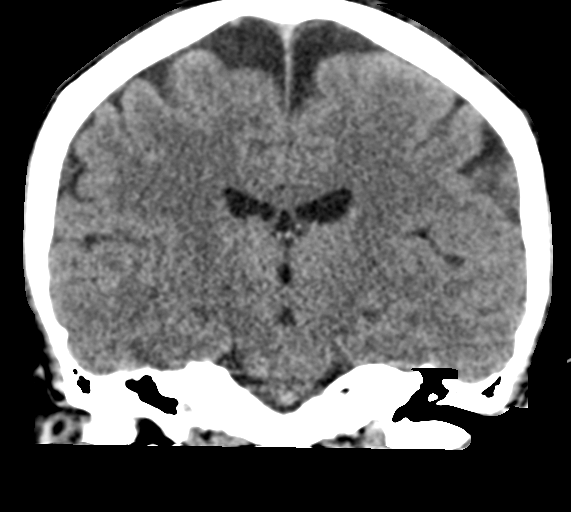

[Series 5: sagittal soft tissue · sagittal · 0.29mm/px · 3 of 51 slices shown]
[im 17/51  brain]
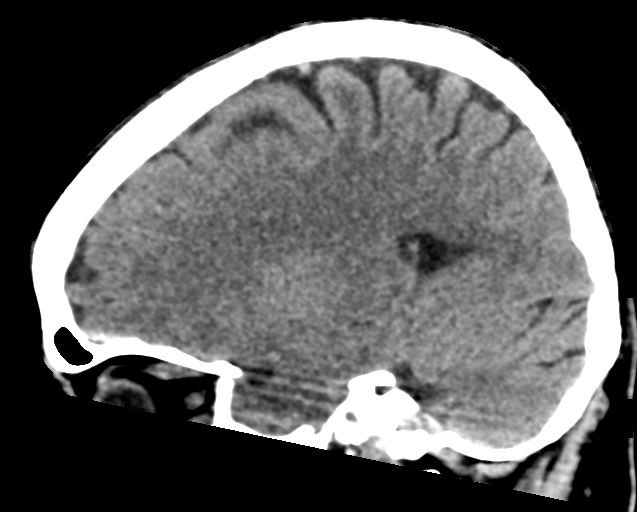
[im 26/51  brain]
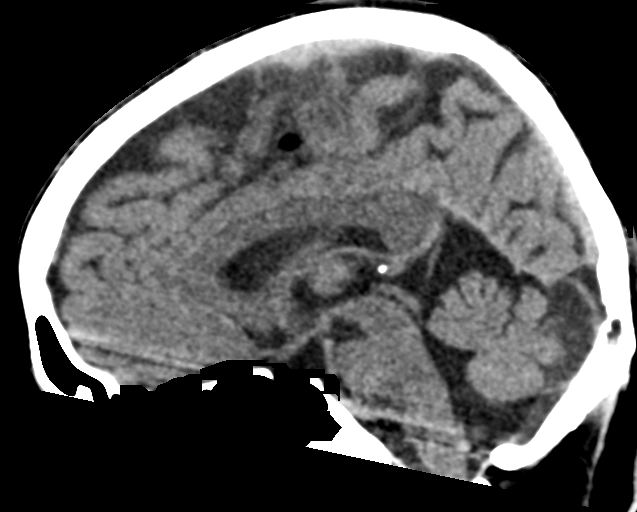
[im 34/51  brain]
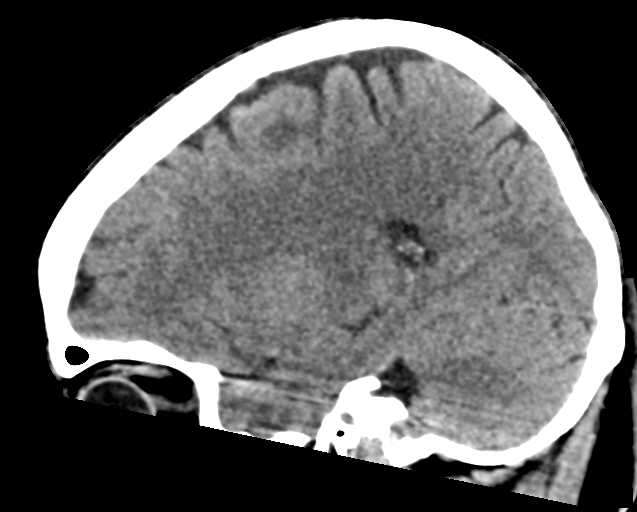

[16 of 45 positions shown; findings below may reference images not displayed]

FINDINGS: Brain: No intracranial hemorrhage, mass effect, or midline shift.
Brain volume is normal for age. No hydrocephalus. The basilar
cisterns are patent. No evidence of territorial infarct or acute
ischemia. No extra-axial or intracranial fluid collection.

Vascular: No hyperdense vessel or unexpected calcification.

Skull: No fracture or focal lesion.

Sinuses/Orbits: Paranasal sinuses and mastoid air cells are clear.
The visualized orbits are unremarkable.

Other: None.
IMPRESSION: No acute intracranial abnormality.

## 2021-09-06 IMAGING — CT CT ABD-PELV W/ CM
2 of 5 series · 15 of 46 positions shown, 17 images · IV contrast (APPLIED)
Comparison: None.

CLINICAL DATA: Abdominal pain and nausea.

EXAM:
CT ABDOMEN AND PELVIS WITH CONTRAST
TECHNIQUE: Multidetector CT imaging of the abdomen and pelvis was performed
using the standard protocol following bolus administration of
intravenous contrast.
CONTRAST:  100mL OMNIPAQUE IOHEXOL 300 MG/ML  SOLN

[Series 2: routine abd/pel with · axial · 0.75mm/px · z∈[-842,-447]mm · 12 of 91 slices shown, 14 images]
[im 6/91  soft-tissue]
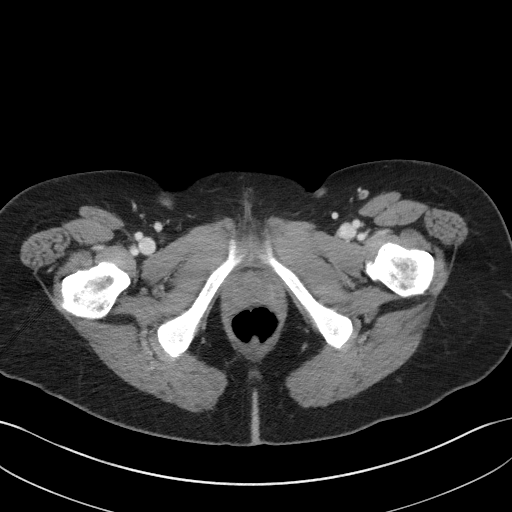
[im 6/91  bone]
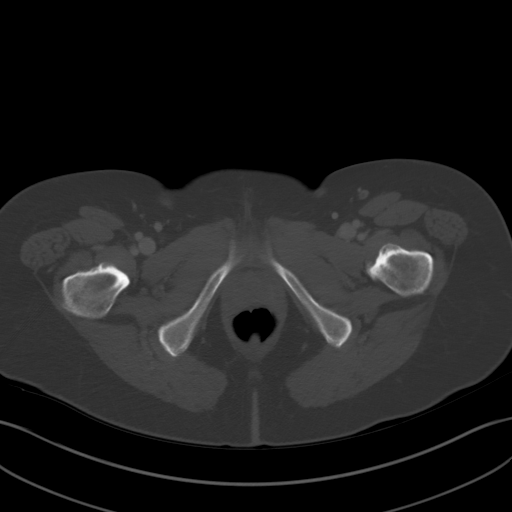
[im 16/91  soft-tissue]
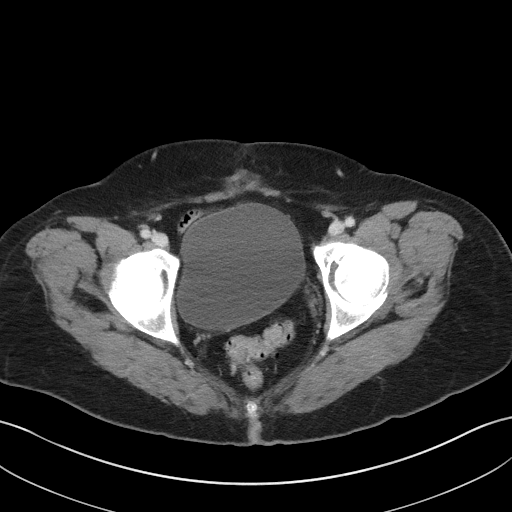
[im 22/91  soft-tissue]
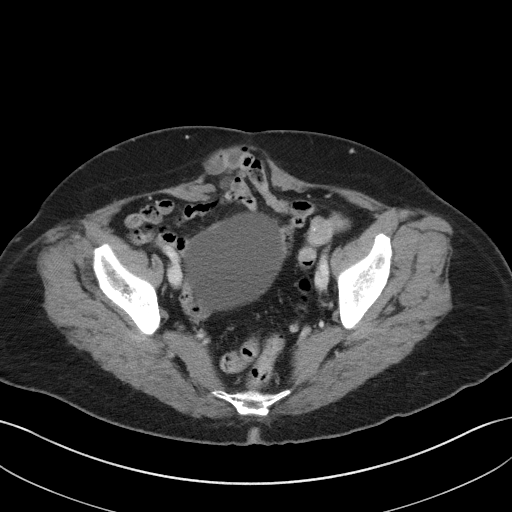
[im 27/91  soft-tissue]
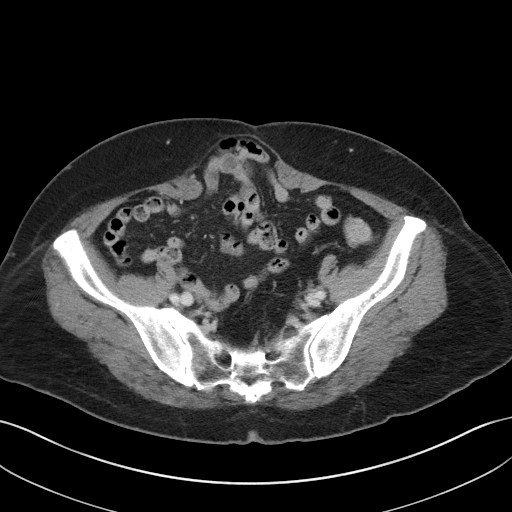
[im 38/91  soft-tissue]
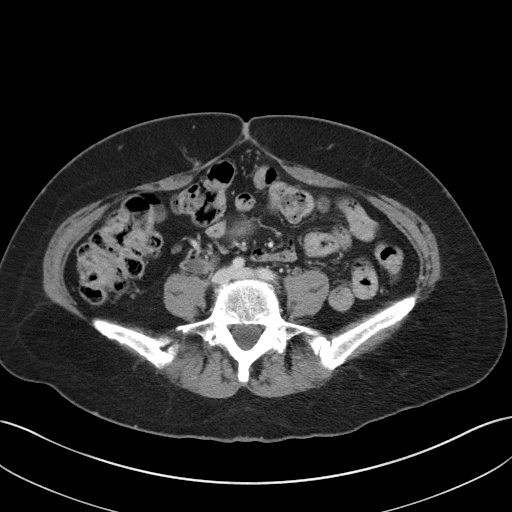
[im 43/91  soft-tissue]
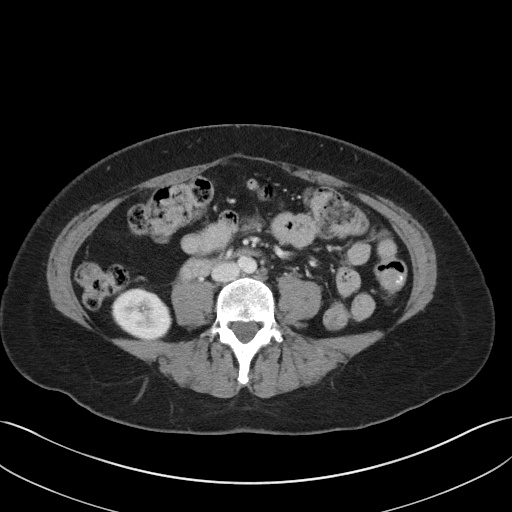
[im 48/91  soft-tissue]
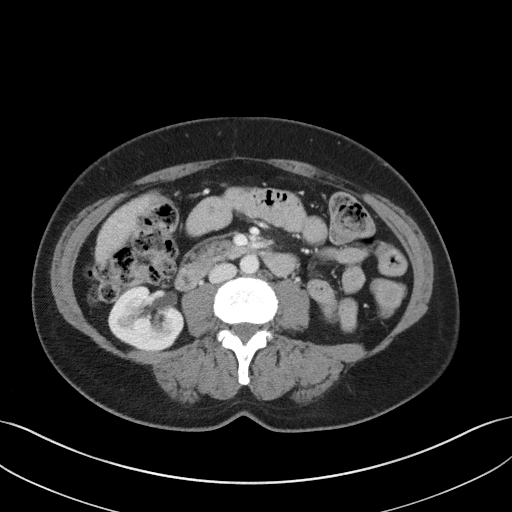
[im 59/91  soft-tissue]
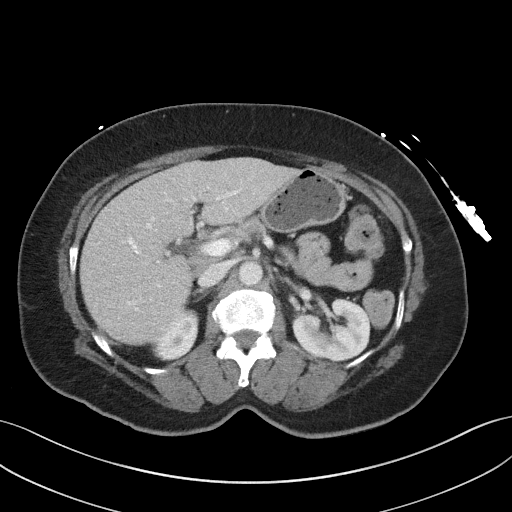
[im 64/91  soft-tissue]
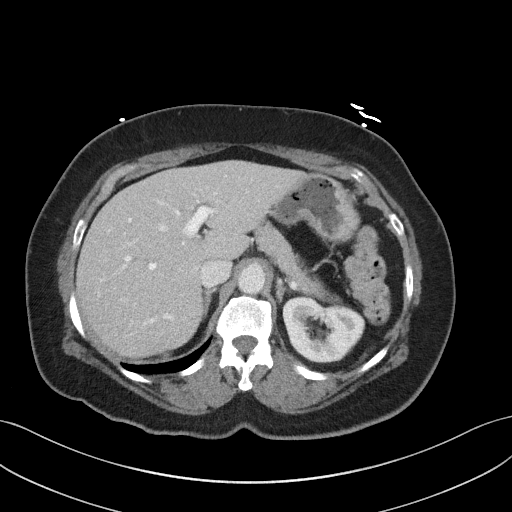
[im 64/91  bone]
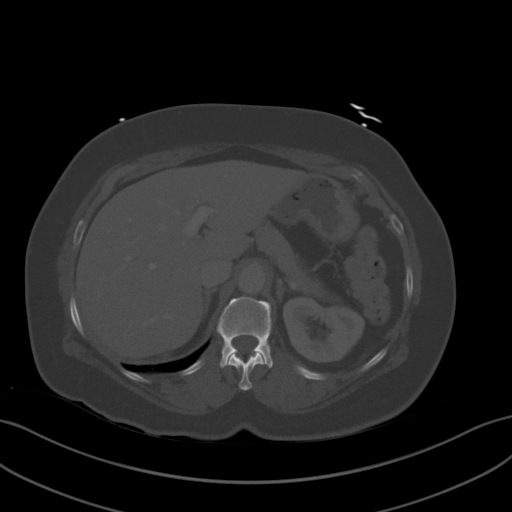
[im 69/91  soft-tissue]
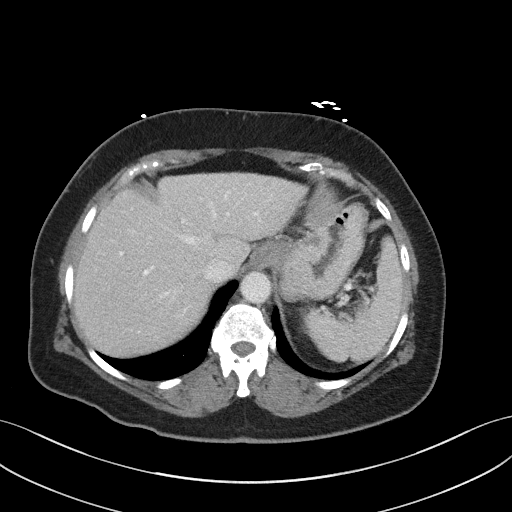
[im 80/91  soft-tissue]
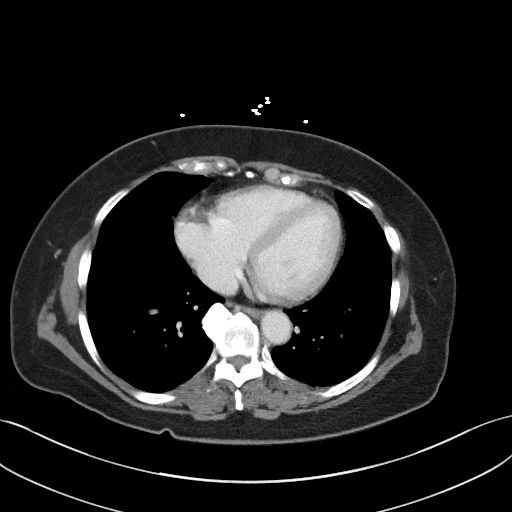
[im 85/91  soft-tissue]
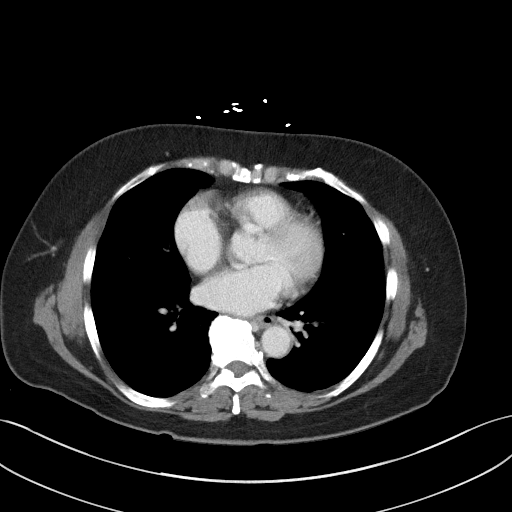

[Series 5: coronal st · coronal · 0.66mm/px · 3 of 73 slices shown]
[im 25/73  soft-tissue]
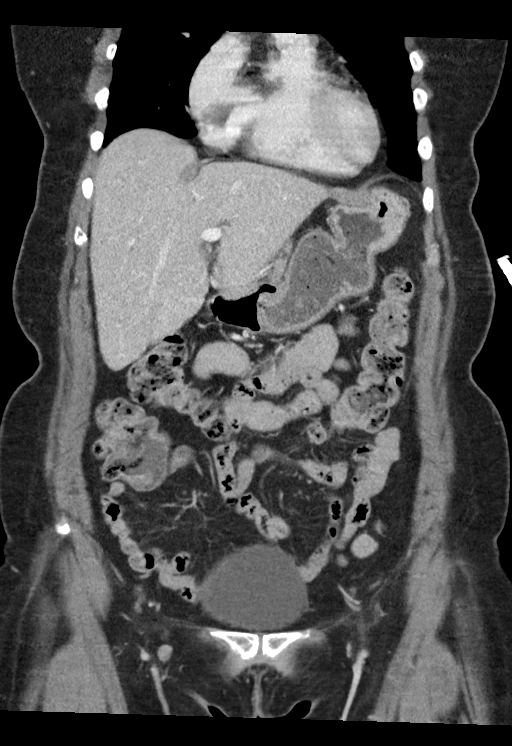
[im 33/73  soft-tissue]
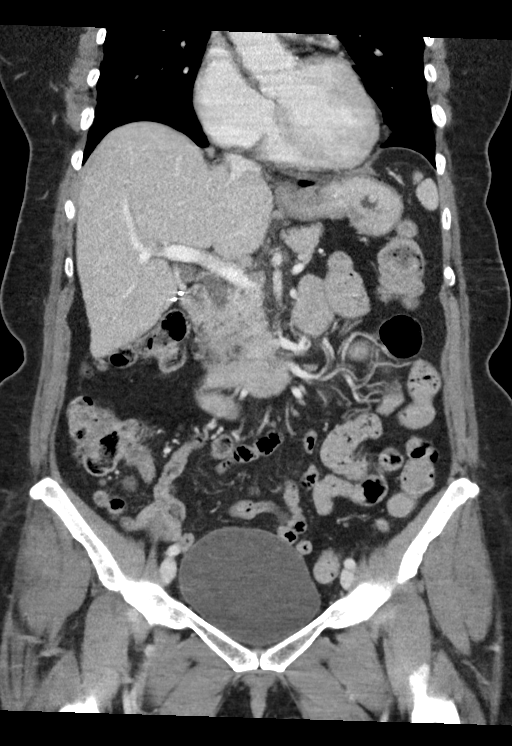
[im 41/73  soft-tissue]
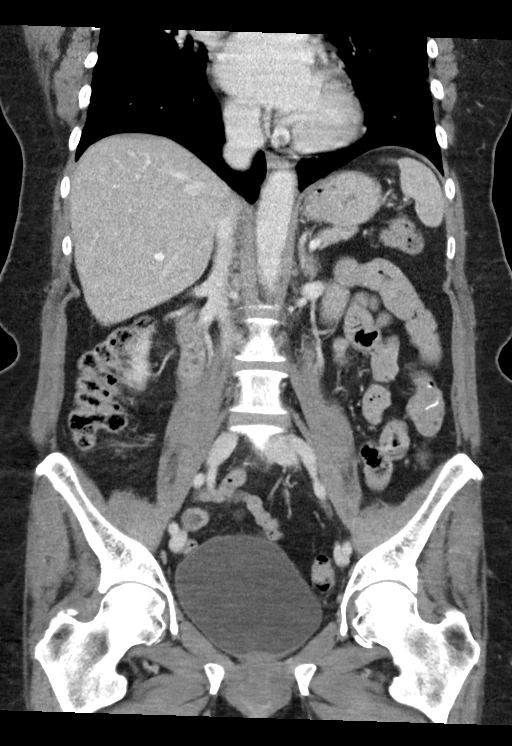

[15 of 46 positions shown; findings below may reference images not displayed]

FINDINGS: Lower chest: 4 mm perifissural nodule in the right middle lobe,
series 4, image 10. Punctate nodule in the left lower lobe, series
4, image 11. Heart is normal in size. No pleural effusion.

Hepatobiliary: No focal liver abnormality is seen. Status post
cholecystectomy. No biliary dilatation. Common bile duct measures 8
mm, normal for postcholecystectomy status.

Pancreas: No ductal dilatation or inflammation.

Spleen: Normal in size without focal abnormality.

Adrenals/Urinary Tract: Normal adrenal glands. No hydronephrosis or
perinephric edema. Homogeneous renal enhancement with symmetric
excretion on delayed phase imaging. Urinary bladder is
physiologically distended without wall thickening.

Stomach/Bowel: Stomach physiologically distended. No small bowel
obstruction or inflammation. Mild fecalization of distal small bowel
contents. Appendix not confidently visualized. No evidence of
appendicitis. Colonic diverticulosis, most prominent in the sigmoid
colon. No diverticulitis. Slight rectus diastasis with fat
containing lower ventral abdominal wall hernia. Nonobstructed
noninflamed small and large bowel extend into the abdominal wall
defect.

Vascular/Lymphatic: Abdominal aorta is normal in caliber. No aortic
aneurysm. Portal vein is patent. Mesenteric vessels are patent. No
adenopathy.

Reproductive: Status post hysterectomy. No adnexal masses.

Other: No free air, free fluid, or intra-abdominal fluid collection.
Rectus diastasis with small fat containing lower ventral abdominal
wall hernia, mild herniation of large and small bowel into the
hernia.

Musculoskeletal: There are no acute or suspicious osseous
abnormalities. Degenerative change of the right sacroiliac joint.
Degenerative disc disease at L5-S1.
IMPRESSION: 1. No acute abnormality in the abdomen/pelvis.
2. Colonic diverticulosis without diverticulitis.
3. Fecalization of distal small bowel contents can be seen with slow
transit. No bowel obstruction or inflammation.
4. Rectus diastasis with lower ventral abdominal wall hernia
containing nonobstructed noninflamed large and small bowel.
5. Pulmonary nodules in the lung bases, largest measuring 4 mm. No
follow-up needed if patient is low-risk (and has no known or
suspected primary neoplasm). Non-contrast chest CT can be considered
in 12 months if patient is high-risk. This recommendation follows
the consensus statement: Guidelines for Management of Incidental
Pulmonary Nodules Detected on CT Images: From the [HOSPITAL]

## 2023-04-30 ENCOUNTER — Other Ambulatory Visit: Payer: Self-pay

## 2023-04-30 DIAGNOSIS — M546 Pain in thoracic spine: Secondary | ICD-10-CM | POA: Insufficient documentation

## 2023-04-30 DIAGNOSIS — G8929 Other chronic pain: Secondary | ICD-10-CM | POA: Insufficient documentation

## 2023-04-30 MED ORDER — OXYCODONE-ACETAMINOPHEN 5-325 MG PO TABS
1.0000 | ORAL_TABLET | ORAL | Status: DC | PRN
  Administered 2023-04-30: 1 via ORAL
  Filled 2023-04-30 (×2): qty 1

## 2023-04-30 NOTE — ED Triage Notes (Addendum)
Patient reports back pain x 2-3 months. Reports pain increases with walking, laying down, movement, etc. Pt reports muscular lump below L scapula which is where pain is centered. Denies known trauma or injury. RN cannot appreciate lump in triage. Pt restless and tearful. Alert and oriented following commands. Breathing unlabored speaking in full sentences. Pt reports took tylenol at home with no relief. Last dose tylenol 1400

## 2023-05-01 ENCOUNTER — Emergency Department
Admission: EM | Admit: 2023-05-01 | Discharge: 2023-05-01 | Disposition: A | Discharge status: Home / Self Care | Payer: Self-pay | Attending: Emergency Medicine | Admitting: Emergency Medicine

## 2023-05-01 ENCOUNTER — Emergency Department: Payer: Self-pay

## 2023-05-01 DIAGNOSIS — G8929 Other chronic pain: Secondary | ICD-10-CM

## 2023-05-01 MED ORDER — ACETAMINOPHEN 500 MG PO TABS
1000.0000 mg | ORAL_TABLET | Freq: Once | ORAL | Status: DC
  Filled 2023-05-01: qty 2

## 2023-05-01 MED ORDER — LIDOCAINE 5 % EX PTCH: 1.0000 | MEDICATED_PATCH | Freq: Two times a day (BID) | CUTANEOUS | 1 refills | Status: DC

## 2023-05-01 MED ORDER — METHOCARBAMOL 500 MG PO TABS: 500.0000 mg | ORAL_TABLET | Freq: Three times a day (TID) | ORAL | 1 refills | Status: DC | PRN

## 2023-05-01 MED ORDER — KETOROLAC TROMETHAMINE 30 MG/ML IJ SOLN
30.0000 mg | Freq: Once | INTRAMUSCULAR | Status: AC
  Administered 2023-05-01: 30 mg via INTRAMUSCULAR
  Filled 2023-05-01: qty 1

## 2023-05-01 MED ORDER — LIDOCAINE 5 % EX PTCH
1.0000 | MEDICATED_PATCH | CUTANEOUS | Status: DC
  Administered 2023-05-01: 1 via TRANSDERMAL
  Filled 2023-05-01: qty 1

## 2023-05-01 MED ORDER — METHOCARBAMOL 500 MG PO TABS
500.0000 mg | ORAL_TABLET | Freq: Once | ORAL | Status: DC
  Filled 2023-05-01: qty 1

## 2023-05-01 NOTE — ED Notes (Signed)
Pt provided meal; ok with provider.

## 2023-05-01 NOTE — ED Provider Notes (Signed)
[facility] Provider Note    Event Date/Time   First MD Initiated Contact with Patient 05/01/23 0111     (approximate)   History   Back Pain   HPI  Carmen Soto is a 67 y.o. female who presents to the ED for evaluation of Back Pain   Patient presents for evaluation of multiple months of atraumatic thoracic back pain.  She reports pain is on the left side, sometimes radiating superiorly or inferiorly, but often originating thoracically.  Sometimes radiates down her left leg.  No original falls or trauma.   Denies any associated dizziness, syncope, dyspnea, anterior discomfort such as chest or abdominal pain.  No urinary symptoms, fever, stool changes, saddle anesthesias.   Physical Exam   Triage Vital Signs: ED Triage Vitals  Encounter Vitals Group     BP 04/30/23 2136 121/84     Systolic BP Percentile --      Diastolic BP Percentile --      Pulse Rate 04/30/23 2136 70     Resp 04/30/23 2136 20     Temp 04/30/23 2136 98.4 F (36.9 C)     Temp Source 04/30/23 2136 Oral     SpO2 04/30/23 2136 100 %     Weight 04/30/23 2140 155 lb (70.3 kg)     Height 04/30/23 2140 5\' 3"  (1.6 m)     Head Circumference --      Peak Flow --      Pain Score 04/30/23 2138 10     Pain Loc --      Pain Education --      Exclude from Growth Chart --     Most recent vital signs: Vitals:   05/01/23 0415 05/01/23 0430  BP:  (!) 146/97  Pulse:  79  Resp:    Temp:    SpO2: 99%     General: Awake, no distress.  CV:  Good peripheral perfusion.  Resp:  Normal effort.  Abd:  No distention.  MSK:  No deformity noted.  Left-sided paraspinal thoracic tenderness without overlying skin changes or signs of trauma. Neuro:  No focal deficits appreciated. Cranial nerves II through XII intact 5/5 strength and sensation in all 4 extremities Other:     ED Results / Procedures / Treatments   Labs (all labs ordered are listed, but only abnormal results are  displayed) Labs Reviewed - No data to display  EKG   RADIOLOGY CXR interpreted by me without evidence of acute cardiopulmonary pathology. Plain film of the thoracic spine interpreted by me without evidence of acute fracture or dislocation  Official radiology report(s): DG Thoracic Spine 2 View  Result Date: 05/01/2023 CLINICAL DATA:  Back and chest wall pain, initial encounter EXAM: THORACIC SPINE 2 VIEWS COMPARISON:  None Available. FINDINGS: Vertebral body height is well maintained. Osteophytic changes are seen. Pedicles are within normal limits. No paraspinal mass is noted. IMPRESSION: Degenerative change without acute abnormality. Electronically Signed   By: Alcide Clever M.D.   On: 05/01/2023 03:21   DG Chest 2 View  Result Date: 05/01/2023 CLINICAL DATA:  Back pain for 2-3 months, initial encounter EXAM: CHEST - 2 VIEW COMPARISON:  08/06/2014 FINDINGS: Cardiac shadow is mildly prominent. The lungs are well aerated bilaterally. No focal infiltrate is seen. Some suggestion of mild nodularity is noted which may be related to prior granulomatous disease. No sizable effusion is seen. Degenerative changes of the thoracic spine are noted. IMPRESSION: Question prior granulomatous disease. No  other focal abnormality is noted. Electronically Signed   By: Alcide Clever M.D.   On: 05/01/2023 03:20    PROCEDURES and INTERVENTIONS:  Procedures  Medications  oxyCODONE-acetaminophen (PERCOCET/ROXICET) 5-325 MG per tablet 1 tablet (1 tablet Oral Not Given 05/01/23 0155)  acetaminophen (TYLENOL) tablet 1,000 mg (1,000 mg Oral Not Given 05/01/23 0224)  methocarbamol (ROBAXIN) tablet 500 mg (500 mg Oral Not Given 05/01/23 0224)  lidocaine (LIDODERM) 5 % 1 patch (1 patch Transdermal Patch Applied 05/01/23 0225)  ketorolac (TORADOL) 30 MG/ML injection 30 mg (30 mg Intramuscular Given 05/01/23 0225)     IMPRESSION / MDM / ASSESSMENT AND PLAN / ED COURSE  I reviewed the triage vital signs and the nursing  notes.  Differential diagnosis includes, but is not limited to, muscular spasm, pneumothorax, rib fracture, disc protrusion, cauda equina  {Patient presents with symptoms of an acute illness or injury that is potentially life-threatening.  Patient presents with chronic thoracic back pain that is likely muscular in etiology and suitable for outpatient management.  Provided nonnarcotic multimodal analgesia with improvement of her symptoms.  No neurologic deficits, preceding trauma or red flag features.  No objective indications for imaging, but patient is quite demanding and manipulative.  We get some plain films that are normal.  She is suitable for outpatient management.  Clinical Course as of 05/01/23 1610  Thu May 01, 2023  [id number] Reassessed with interpreter [DS]    Clinical Course User Index [DS] Delton Prairie, MD     FINAL CLINICAL IMPRESSION(S) / ED DIAGNOSES   Final diagnoses:  Chronic left-sided thoracic back pain     Rx / DC Orders   ED Discharge Orders          Ordered    lidocaine (LIDODERM) 5 %  Every 12 hours        05/01/23 0433    methocarbamol (ROBAXIN) 500 MG tablet  Every 8 hours PRN        05/01/23 0433             Note:  This document was prepared using Dragon voice recognition software and may include unintentional dictation errors.   Delton Prairie, MD 05/01/23 0630

## 2023-05-01 NOTE — ED Notes (Signed)
Interpreter used during d/c process.

## 2023-05-01 NOTE — Discharge Instructions (Signed)
Use Tylenol for pain and fevers.  Up to 1000 mg per dose, up to 4 times per day.  Do not take more than 4000 mg of Tylenol/acetaminophen within 24 hours..  Use naproxen/Aleve for anti-inflammatory pain relief. Use up to 500mg  every 12 hours. Do not take more frequently than this. Do not use other NSAIDs (ibuprofen, Advil) while taking this medication. It is safe to take Tylenol with this.   Please use lidocaine patches at your site of pain.  Apply 1 patch at a time, leave on for 12 hours, then remove for 12 hours.  12 hours on, 12 hours off.  Do not apply more than 1 patch at a time.  Use Robaxin muscle relaxer as needed for more severe/breakthrough pain, up to 3 times per day. This medication can make some people sleepy, so do not use while driving, working or Designer, television/film set

## 2023-05-01 NOTE — ED Notes (Signed)
Pt refused medication ordered. MD notified and at bedside.

## 2023-05-01 NOTE — ED Notes (Signed)
Pt stated her stomach was irritated d/t last po med since she did not eat earlier. RN brought food to eat prior to admin to decrease irritation with next dose. Pt still refused. RN asked if she wanted to eat and RN would wait longer to give pain meds. Pt still refused and states she doesn't want any pills. Pt upset that provider did order imaging and wants some done today to find out what is causing her pain. RN explained imaging her is used to r/o emergent causes and since this has been going on for 5 years with no injury we may not have a diagnosis for her. She states she has seen several providers in the past and has been told "many things". Provider notified.

## 2023-05-04 ENCOUNTER — Emergency Department: Payer: Self-pay

## 2023-05-04 ENCOUNTER — Emergency Department
Admission: EM | Admit: 2023-05-04 | Discharge: 2023-05-04 | Disposition: A | Discharge status: Home / Self Care | Payer: Self-pay | Attending: Emergency Medicine | Admitting: Emergency Medicine

## 2023-05-04 ENCOUNTER — Other Ambulatory Visit: Payer: Self-pay

## 2023-05-04 DIAGNOSIS — G8929 Other chronic pain: Secondary | ICD-10-CM

## 2023-05-04 DIAGNOSIS — M5440 Lumbago with sciatica, unspecified side: Secondary | ICD-10-CM | POA: Insufficient documentation

## 2023-05-04 DIAGNOSIS — I10 Essential (primary) hypertension: Secondary | ICD-10-CM | POA: Insufficient documentation

## 2023-05-04 DIAGNOSIS — M546 Pain in thoracic spine: Secondary | ICD-10-CM | POA: Insufficient documentation

## 2023-05-04 HISTORY — DX: Dorsalgia, unspecified: M54.9

## 2023-05-04 LAB — CBC WITH DIFFERENTIAL/PLATELET
Abs Immature Granulocytes: 0.01 10*3/uL (ref 0.00–0.07)
Basophils Absolute: 0.1 10*3/uL (ref 0.0–0.1)
Basophils Relative: 1 %
Eosinophils Absolute: 0.2 10*3/uL (ref 0.0–0.5)
Eosinophils Relative: 3 %
HCT: 39 % (ref 36.0–46.0)
Hemoglobin: 12.6 g/dL (ref 12.0–15.0)
Immature Granulocytes: 0 %
Lymphocytes Relative: 24 %
Lymphs Abs: 2 10*3/uL (ref 0.7–4.0)
MCH: 29.8 pg (ref 26.0–34.0)
MCHC: 32.3 g/dL (ref 30.0–36.0)
MCV: 92.2 fL (ref 80.0–100.0)
Monocytes Absolute: 0.7 10*3/uL (ref 0.1–1.0)
Monocytes Relative: 9 %
Neutro Abs: 5.2 10*3/uL (ref 1.7–7.7)
Neutrophils Relative %: 63 %
Platelets: 262 10*3/uL (ref 150–400)
RBC: 4.23 MIL/uL (ref 3.87–5.11)
RDW: 13 % (ref 11.5–15.5)
WBC: 8.2 10*3/uL (ref 4.0–10.5)
nRBC: 0 % (ref 0.0–0.2)

## 2023-05-04 LAB — COMPREHENSIVE METABOLIC PANEL
ALT: 40 U/L (ref 0–44)
AST: 42 U/L — ABNORMAL HIGH (ref 15–41)
Albumin: 3.5 g/dL (ref 3.5–5.0)
Alkaline Phosphatase: 139 U/L — ABNORMAL HIGH (ref 38–126)
Anion gap: 7 (ref 5–15)
BUN: 23 mg/dL (ref 8–23)
CO2: 25 mmol/L (ref 22–32)
Calcium: 9.3 mg/dL (ref 8.9–10.3)
Chloride: 108 mmol/L (ref 98–111)
Creatinine, Ser: 0.73 mg/dL (ref 0.44–1.00)
GFR, Estimated: >60 mL/min (ref >60)
Glucose, Bld: 122 mg/dL — ABNORMAL HIGH (ref 70–99)
Potassium: 3.8 mmol/L (ref 3.5–5.1)
Sodium: 140 mmol/L (ref 135–145)
Total Bilirubin: 0.4 mg/dL (ref 0.3–1.2)
Total Protein: 7.9 g/dL (ref 6.5–8.1)

## 2023-05-04 MED ORDER — PREDNISONE 10 MG (21) PO TBPK
ORAL_TABLET | ORAL | 0 refills | Status: DC
Start: 2023-05-04 — End: 2023-05-16

## 2023-05-04 MED ORDER — IOHEXOL 350 MG/ML SOLN
75.0000 mL | Freq: Once | INTRAVENOUS | Status: AC | PRN
  Administered 2023-05-04: 75 mL via INTRAVENOUS

## 2023-05-04 MED ORDER — MORPHINE SULFATE (PF) 4 MG/ML IV SOLN
4.0000 mg | Freq: Once | INTRAVENOUS | Status: AC
  Administered 2023-05-04: 4 mg via INTRAVENOUS
  Filled 2023-05-04: qty 1

## 2023-05-04 MED ORDER — BACLOFEN 10 MG PO TABS
10.0000 mg | ORAL_TABLET | Freq: Two times a day (BID) | ORAL | 0 refills | Status: AC
Start: 2023-05-04 — End: 2023-05-11

## 2023-05-04 MED ORDER — ONDANSETRON HCL 4 MG/2ML IJ SOLN
4.0000 mg | Freq: Once | INTRAMUSCULAR | Status: AC
  Administered 2023-05-04: 4 mg via INTRAVENOUS
  Filled 2023-05-04: qty 2

## 2023-05-04 MED ORDER — SODIUM CHLORIDE 0.9 % IV BOLUS
500.0000 mL | Freq: Once | INTRAVENOUS | Status: AC
  Administered 2023-05-04: 500 mL via INTRAVENOUS

## 2023-05-04 MED ORDER — OXYCODONE-ACETAMINOPHEN 5-325 MG PO TABS: 1.0000 | ORAL_TABLET | ORAL | 0 refills | Status: DC | PRN

## 2023-05-04 NOTE — ED Provider Notes (Signed)
[facility] Provider Note    Event Date/Time   First MD Initiated Contact with Patient 05/04/23 1137     (approximate)   History   Back Pain   HPI  Carmen Soto is a 67 y.o. female with history of hypertension, hypercholesterolemia, rheumatoid arthritis and back pain presents emergency department complaining of thoracic pain that radiates in to the front around her stomach area and middle.  States pain is severe.  States she was seen last week and was given ibuprofen and a muscle relaxer which did not help her at all.  Patient states she does have back problems and knows she needs to have surgery at [facility].  However the pain is different and does radiate through to the front which she does not normally do.  States she does get sweaty with the pain.  Does not become short of breath.  No vomiting.      Physical Exam   Triage Vital Signs: ED Triage Vitals  Encounter Vitals Group     BP 05/04/23 1108 124/73     Systolic BP Percentile --      Diastolic BP Percentile --      Pulse Rate 05/04/23 1108 79     Resp 05/04/23 1108 20     Temp 05/04/23 1108 98 F (36.7 C)     Temp Source 05/04/23 1108 Oral     SpO2 05/04/23 1108 97 %     Weight 05/04/23 1108 154 lb 15.7 oz (70.3 kg)     Height 05/04/23 1108 5\' 3"  (1.6 m)     Head Circumference --      Peak Flow --      Pain Score 05/04/23 1105 9     Pain Loc --      Pain Education --      Exclude from Growth Chart --     Most recent vital signs: Vitals:   05/04/23 1108  BP: 124/73  Pulse: 79  Resp: 20  Temp: 98 F (36.7 C)  SpO2: 97%     General: Awake, no distress.   CV:  Good peripheral perfusion. regular rate and  rhythm Resp:  Normal effort. Lungs cta Abd:  No distention.  Tender along the midline, no pulsatile mass noted, bowel sounds normal, no bruits appreciated Other:  T-spine tender along T4-T5, neurovascular intact   ED Results / Procedures / Treatments   Labs (all labs  ordered are listed, but only abnormal results are displayed) Labs Reviewed  COMPREHENSIVE METABOLIC PANEL - Abnormal; Notable for the following components:      Result Value   Glucose, Bld 122 (*)    AST 42 (*)    Alkaline Phosphatase 139 (*)    All other components within normal limits  CBC WITH DIFFERENTIAL/PLATELET     EKG     RADIOLOGY CTA chest abdomen pelvis for dissection    PROCEDURES:   Procedures   MEDICATIONS ORDERED IN ED: Medications  morphine (PF) 4 MG/ML injection 4 mg (4 mg Intravenous Given 05/04/23 1212)  ondansetron (ZOFRAN) injection 4 mg (4 mg Intravenous Given 05/04/23 1212)  sodium chloride 0.9 % bolus 500 mL (0 mLs Intravenous Stopped 05/04/23 1235)  iohexol (OMNIPAQUE) 350 MG/ML injection 75 mL (75 mLs Intravenous Contrast Given 05/04/23 1259)     IMPRESSION / MDM / ASSESSMENT AND PLAN / ED COURSE  I reviewed the triage vital signs and the nursing notes.  Differential diagnosis includes, but is not limited to, dissection, PUD secondary to NSAID use, thoracic radiculopathy  Patient's presentation is most consistent with acute presentation with potential threat to life or bodily function.   Labs ordered, imaging CTA chest abdomen pelvis for dissection due to the thoracic pain that radiates into the abdomen. No charge T-spine  Patient was given a IV, normal saline 500 mL, morphine 4 mg IV, Zofran 4 mg IV  Patient's labs are reassuring  CTA chest abdomen pelvis for dissection, did independently review and interpret the radiologist reading as being negative for any acute abnormality, T-spine portion of the CTA shows narrowing of T11 and 12 no compression fractures  The patient had relief with pain medication.  Had a long discussion with her following up with neurosurgery or her regular doctor.  Patient states she does not have insurance.  Told her could have registration come back in to discuss charity care with her.   She was given a prescription for Sterapred, baclofen, and oxycodone.  She is to return the emergency department worsening.  Patient is in agreement treatment plan.  Discharged stable condition.     FINAL CLINICAL IMPRESSION(S) / ED DIAGNOSES   Final diagnoses:  Chronic low back pain with sciatica, sciatica laterality unspecified, unspecified back pain laterality     Rx / DC Orders   ED Discharge Orders          Ordered    predniSONE (STERAPRED UNI-PAK 21 TAB) 10 MG (21) TBPK tablet        05/04/23 1420    baclofen (LIORESAL) 10 MG tablet  2 times daily        05/04/23 1420    oxyCODONE-acetaminophen (PERCOCET) 5-325 MG tablet  Every 4 hours PRN        05/04/23 1422             Note:  This document was prepared using Dragon voice recognition software and may include unintentional dictation errors.    Faythe Ghee, PA-C 05/04/23 [name], MD 05/05/23 [id number]

## 2023-05-04 NOTE — ED Triage Notes (Signed)
Pt to ED for back pain to mid-upper back since months, severe since 3 months ago. Pt is ambulatory, Spanish speaking. Describes pain as "pulling". Seen here this past week for same.  Says that pain meds were giving her abdominal pain so stopped taking. Pt is ambulatory.

## 2023-05-13 NOTE — Progress Notes (Signed)
Referring Physician:  No referring provider defined for this encounter.  Primary Physician:  Carmen Fleeting, MD  Interpreter used as patient speaks spanish.   History of Present Illness: 05/16/2023 [name] has a history of HTN, hypercholesterolemia.   Seen in ED on 05/01/23 and again on 05/04/23 for chronic left sided thoracic pain.   She has chronic constant pain on left side of her mid back that radiates around to her chest x years that has been worse in last 3 months. No specific aggravating or alleviating factors. She has pain with laying flat at night. She feels like her balance is poor.   Saw her PCP yesterday and [place] of her liver was ordered.   Given lidoderm and robaxin with no relief. Then given prednisone pack, baclofen, and percocet. These did not help.   She is taking not taking neurontin as it did not help.   Bowel/Bladder Dysfunction: none  Conservative measures:  Physical therapy: she did 2 years ago with no relief  Multimodal medical therapy including regular antiinflammatories: lidoderm, robaxin, prednisone, percocet, baclofen  Injections: No epidural steroid injections  Past Surgery: No spinal surgery  Carmen Soto has no symptoms of cervical myelopathy.  The symptoms are causing a significant impact on the patient's life.   Review of Systems:  A 10 point review of systems is negative, except for the pertinent positives and negatives detailed in the HPI.  Past Medical History: Past Medical History:  Diagnosis Date   Arthritis    Back pain    Hypercholesteremia    Hypertension     Past Surgical History: Past Surgical History:  Procedure Laterality Date   ABDOMINAL HYSTERECTOMY     CESAREAN SECTION     FOREIGN BODY REMOVAL ESOPHAGEAL N/A 01/28/2017   Procedure: REMOVAL FOREIGN BODY ESOPHAGEAL;  Surgeon: Linus Salmons, MD;  Location: [facility] ORS;  Service: ENT;  Laterality: N/A;   OOPHORECTOMY       Allergies: Allergies as of 05/16/2023 - Review Complete 05/16/2023  Allergen Reaction Noted   Tramadol Other (See Comments) and Palpitations 01/17/2020    Medications: Outpatient Encounter Medications as of 05/16/2023  Medication Sig   atorvastatin (LIPITOR) 40 MG tablet Take 40 mg by mouth.   gabapentin (NEURONTIN) 600 MG tablet Take 600 mg by mouth.   lidocaine (LIDODERM) 5 % Place 1 patch onto the skin every 12 (twelve) hours. Remove & Discard patch within 12 hours or as directed by MD   lisinopril-hydrochlorothiazide (PRINZIDE,ZESTORETIC) 10-12.5 MG tablet Take 10-12.5 tablets by mouth.   methocarbamol (ROBAXIN) 500 MG tablet Take 1 tablet (500 mg total) by mouth every 8 (eight) hours as needed.   oxyCODONE-acetaminophen (PERCOCET) 5-325 MG tablet Take 1 tablet by mouth every 4 (four) hours as needed for severe pain.   predniSONE (STERAPRED UNI-PAK 21 TAB) 10 MG (21) TBPK tablet Take 6 pills on day one then decrease by 1 pill each day   No facility-administered encounter medications on file as of 05/16/2023.    Social History: Social History   Tobacco Use   Smoking status: Never   Smokeless tobacco: Never  Substance Use Topics   Alcohol use: No   Drug use: No    Family Medical History: No family history on file.  Physical Examination: Vitals:   05/16/23 0902  BP: 116/66    General: Patient is well developed, well nourished, calm, collected, and in no apparent distress. Attention to examination is appropriate. She is tearful at times due  to pain.   Respiratory: Patient is breathing without any difficulty.   NEUROLOGICAL:     Awake, alert, oriented to person, place, and time.  Speech is clear and fluent. Fund of knowledge is appropriate.   Cranial Nerves: Pupils equal round and reactive to light.  Facial tone is symmetric.    She has mid thoracic tenderness to scapular region.   No abnormal lesions on exposed skin.   Strength: Side Biceps Triceps Deltoid  Interossei Grip Wrist Ext. Wrist Flex.  R 5 5 5 5 5 5 5   L 5 5 5 5 5 5 5    Side Iliopsoas Quads Hamstring PF DF EHL  R 5 5 5 5 5 5   L 5 5 5 5 5 5    She has a lot of pain with strength testing upper extremities. No gross weakness.   Reflexes are 2+ and symmetric at the biceps, brachioradialis, patella and achilles.   Hoffman's is absent.  Clonus is not present.   Bilateral upper and lower extremity sensation is intact to light touch, but left lower extremity sensation is diminished compared to right.   Gait is normal.    Medical Decision Making  Imaging: Thoracic xrays dated 05/01/23:  FINDINGS: Vertebral body height is well maintained. Osteophytic changes are seen. Pedicles are within normal limits. No paraspinal mass is noted.   IMPRESSION: Degenerative change without acute abnormality.     Electronically Signed   By: Alcide Clever M.D.   On: 05/01/2023 03:21   CT of thoracic spine dated 05/04/23:  FINDINGS: Alignment: Normal.   Vertebrae: No acute fracture or suspicious bone lesion. Diffuse idiopathic skeletal hyperostosis.   Paraspinal and other soft tissues: Unremarkable.   Disc levels: No spinal canal stenosis. Left-greater-than-right facet arthropathy at T11-12 results in moderate left neural foraminal narrowing.   IMPRESSION: 1. No acute fracture or traumatic malalignment of the thoracic spine. 2. Left-greater-than-right facet arthropathy at T11-12 results in moderate left neural foraminal narrowing.     Electronically Signed   By: Orvan Falconer M.D.   On: 05/04/2023 13:32    I have personally reviewed the images and agree with the above interpretation.  Assessment and Plan: Carmen Soto is a pleasant 67 y.o. female has chronic constant pain on left side of her mid back that radiates around to her chest x years that has been worse in last 3 months. She has numbness and tingling in her back.   She has known thoracic spondylosis.   Treatment  options discussed with patient and following plan made:   - MRI of thoracic spine ordered to further evaluate her thoracic radicular symptoms.  - No improvement with previous medications (see above).  - New prescription for flexeril to take prn muscle spasms. Reviewed dosing and side effects. Discussed this can cause drowsiness.  - Depending on MRI results, may refer to pain management to discuss injections.  - No relief with previous PT 2 years ago. May revisit as well.  - Will schedule follow up visit to review MRI results once I get them back. She will need an interpreter.   Of note, she is self pay. Given application for [facility] in spanish. Advised to start filling out so that she can submit it after the MRI is done.   I spent a total of 40 minutes in face-to-face and non-face-to-face activities related to this patient's care today including review of outside records, review of imaging, review of symptoms, physical exam, discussion of differential  diagnosis, discussion of treatment options, and documentation.   Thank you for involving me in the care of this patient.   Drake Leach PA-C Dept. of Neurosurgery

## 2023-05-16 ENCOUNTER — Encounter: Payer: Self-pay | Admitting: Orthopedic Surgery

## 2023-05-16 ENCOUNTER — Ambulatory Visit (INDEPENDENT_AMBULATORY_CARE_PROVIDER_SITE_OTHER): Payer: Self-pay | Admitting: Orthopedic Surgery

## 2023-05-16 VITALS — BP 116/66 | Ht 62.0 in | Wt 151.6 lb

## 2023-05-16 DIAGNOSIS — M47814 Spondylosis without myelopathy or radiculopathy, thoracic region: Secondary | ICD-10-CM

## 2023-05-16 DIAGNOSIS — M5414 Radiculopathy, thoracic region: Secondary | ICD-10-CM

## 2023-05-16 DIAGNOSIS — M4724 Other spondylosis with radiculopathy, thoracic region: Secondary | ICD-10-CM

## 2023-05-16 MED ORDER — CYCLOBENZAPRINE HCL 10 MG PO TABS
10.0000 mg | ORAL_TABLET | Freq: Three times a day (TID) | ORAL | 0 refills | Status: AC | PRN
Start: 2023-05-16 — End: ?

## 2023-05-22 ENCOUNTER — Ambulatory Visit
Admission: RE | Admit: 2023-05-22 | Discharge: 2023-05-22 | Disposition: A | Discharge status: Home / Self Care | Payer: Self-pay | Source: Ambulatory Visit | Attending: Orthopedic Surgery | Admitting: Orthopedic Surgery

## 2023-05-22 DIAGNOSIS — M47814 Spondylosis without myelopathy or radiculopathy, thoracic region: Secondary | ICD-10-CM | POA: Insufficient documentation

## 2023-05-22 DIAGNOSIS — M5414 Radiculopathy, thoracic region: Secondary | ICD-10-CM | POA: Insufficient documentation

## 2023-06-13 NOTE — Progress Notes (Unsigned)
Referring Physician:  No referring provider defined for this encounter.  Primary Physician:  Preston Fleeting, MD  Interpreter used as patient speaks spanish.   History of Present Illness: 06/13/2023 [name] has a history of HTN, hypercholesterolemia.   Last seen by me on 05/16/23 for thoracic pain with known thoracic spondylosis.   She was given flexeril at her last visit. She is here to review her thoracic MRI.   She had some improvement with flexeril, but it caused nausea. She still has constant pain on left side of her mid back. Radiating pain to left chest is better. No specific aggravating or alleviating factors.   She also has constant neck pain with left arm pain to her hand. She has numbness and tingling in her left arm. No weakness. No alleviating/aggravating factors.   She also complains of right knee pain that is more posterior.   Bowel/Bladder Dysfunction: none  Conservative measures:  Physical therapy: she did 2 years ago with no relief  Multimodal medical therapy including regular antiinflammatories: lidoderm, robaxin, prednisone, percocet, baclofen, neurontin, flexeril Injections: No epidural steroid injections  Past Surgery: No spinal surgery  Carmen Kales Skyann Ganim has no symptoms of cervical myelopathy.  The symptoms are causing a significant impact on the patient's life.   Review of Systems:  A 10 point review of systems is negative, except for the pertinent positives and negatives detailed in the HPI.  Past Medical History: Past Medical History:  Diagnosis Date   Arthritis    Back pain    Hypercholesteremia    Hypertension     Past Surgical History: Past Surgical History:  Procedure Laterality Date   ABDOMINAL HYSTERECTOMY     CESAREAN SECTION     FOREIGN BODY REMOVAL ESOPHAGEAL N/A 01/28/2017   Procedure: REMOVAL FOREIGN BODY ESOPHAGEAL;  Surgeon: Linus Salmons, MD;  Location: [facility] ORS;  Service: ENT;  Laterality: N/A;    OOPHORECTOMY      Allergies: Allergies as of 06/16/2023 - Review Complete 05/16/2023  Allergen Reaction Noted   Tramadol Other (See Comments) and Palpitations 01/17/2020    Medications: Outpatient Encounter Medications as of 06/16/2023  Medication Sig   atorvastatin (LIPITOR) 40 MG tablet Take 40 mg by mouth.   cyclobenzaprine (FLEXERIL) 10 MG tablet Take 1 tablet (10 mg total) by mouth 3 (three) times daily as needed for muscle spasms. This can make you sleepy.   gabapentin (NEURONTIN) 600 MG tablet Take 600 mg by mouth.   lidocaine (LIDODERM) 5 % Place 1 patch onto the skin every 12 (twelve) hours. Remove & Discard patch within 12 hours or as directed by MD   lisinopril-hydrochlorothiazide (PRINZIDE,ZESTORETIC) 10-12.5 MG tablet Take 10-12.5 tablets by mouth.   No facility-administered encounter medications on file as of 06/16/2023.    Social History: Social History   Tobacco Use   Smoking status: Never   Smokeless tobacco: Never  Substance Use Topics   Alcohol use: No   Drug use: No    Family Medical History: No family history on file.  Physical Examination: There were no vitals filed for this visit.    Awake, alert, oriented to person, place, and time.  Speech is clear and fluent. Fund of knowledge is appropriate.   Cranial Nerves: Pupils equal round and reactive to light.  Facial tone is symmetric.    She has mid thoracic tenderness to scapular region.   She has lower posterior cervical tenderness into left trapezial region.   No abnormal lesions on  exposed skin.   Strength: Side Biceps Triceps Deltoid Interossei Grip Wrist Ext. Wrist Flex.  R 5 5 5 5 5 5 5   L 5 5 5 5 5 5 5    Side Iliopsoas Quads Hamstring PF DF EHL  R 5 5 5 5 5 5   L 5 5 5 5 5 5     Reflexes are 2+ and symmetric at the biceps, brachioradialis, patella and achilles.   Hoffman's is absent.  Clonus is not present.   Bilateral upper and lower extremity sensation is intact to light touch,  but left upper extremity sensation is diminished compared to right.   She has good ROM of right knee. She has no joint line tenderness. She has tenderness in popliteal fossa.   Gait is normal.    Medical Decision Making  Imaging: Thoracic MRI dated 05/22/23:  FINDINGS: Alignment: Straightening/mild reversal of the normal cervical lordosis with trace retrolisthesis of C5 on C6. Trace anterolisthesis of T2 on T3. Mild thoracic scoliosis.   Vertebrae: No fracture, suspicious marrow lesion, or evidence of discitis. Multilevel bridging anterior vertebral osteophytes in the thoracic spine as shown on the prior CT suggestive of diffuse idiopathic skeletal hyperostosis. Facet ankylosis at T5-6.   Cord:  Normal signal.   Paraspinal and other soft tissues: Unremarkable.   Disc levels:   Incompletely evaluated cervical disc degeneration, with disc bulging at C5-6 resulting in mild spinal stenosis. Mild-to-moderate right neural foraminal stenosis at C6-7.   Shallow right paracentral disc protrusion at T7-8 without spinal stenosis or spinal cord mass effect. Moderate multilevel facet arthrosis contributing to mild multilevel neural foraminal stenosis.   IMPRESSION: 1. Thoracic spondylosis and facet arthrosis without spinal stenosis or compressive neural foraminal stenosis. 2. Mild spinal stenosis and mild-to-moderate right neural foraminal stenosis at C6-7.     Electronically Signed   By: Sebastian Ache M.D.   On: 06/07/2023 12:49   I have personally reviewed the images and agree with the above interpretation.  Assessment and Plan: [name] is a pleasant 67 y.o. female who has constant pain on left side of her mid back. Radiating pain to left chest is better. No specific aggravating or alleviating factors.   Above MRI shows thoracic spondylosis. No compression.   She also has constant neck pain with left arm pain to her hand. She has numbness and tingling in her left arm.  No weakness.   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 Cannot be fully evaluated.   She also complains of right knee pain that is more posterior.    Treatment options discussed with patient and following plan made:   - Referral to pain management ([facility]) to discuss possible thoracic injections.  - No relief with PT 2 years ago so will hold on revisiting it.  - Flexeril helps but causes nausea. She will stop this. New prescription for zanaflex to take prn muscle spasms. Reviewed dosing and side effects. Discussed this can cause drowsiness.  - MRI of cervical spine to evaluate cervical spondylosis and disc bulging seen on thoracic MRI.  - Referral to ortho for right knee pain.  - Will schedule follow up visit to review MRI results once I get them back. She will need an interpreter.   Of note, she likely will be approved for [facility].   I spent a total of 30 minutes in face-to-face and non-face-to-face activities related to this patient's care today including review of outside records, review  of imaging, review of symptoms, physical exam, discussion of differential diagnosis, discussion of treatment options, and documentation.   Thank you for involving me in the care of this patient.   Drake Leach PA-C Dept. of Neurosurgery

## 2023-06-16 ENCOUNTER — Encounter: Payer: Self-pay | Admitting: Orthopedic Surgery

## 2023-06-16 ENCOUNTER — Ambulatory Visit (INDEPENDENT_AMBULATORY_CARE_PROVIDER_SITE_OTHER): Payer: Self-pay | Admitting: Orthopedic Surgery

## 2023-06-16 VITALS — BP 130/82 | Ht 62.0 in | Wt 151.0 lb

## 2023-06-16 DIAGNOSIS — M25561 Pain in right knee: Secondary | ICD-10-CM

## 2023-06-16 DIAGNOSIS — M47814 Spondylosis without myelopathy or radiculopathy, thoracic region: Secondary | ICD-10-CM

## 2023-06-16 DIAGNOSIS — M47812 Spondylosis without myelopathy or radiculopathy, cervical region: Secondary | ICD-10-CM

## 2023-06-16 DIAGNOSIS — M4802 Spinal stenosis, cervical region: Secondary | ICD-10-CM

## 2023-06-16 DIAGNOSIS — M50322 Other cervical disc degeneration at C5-C6 level: Secondary | ICD-10-CM

## 2023-06-16 DIAGNOSIS — M5412 Radiculopathy, cervical region: Secondary | ICD-10-CM

## 2023-06-16 DIAGNOSIS — M4722 Other spondylosis with radiculopathy, cervical region: Secondary | ICD-10-CM

## 2023-06-16 DIAGNOSIS — M542 Cervicalgia: Secondary | ICD-10-CM

## 2023-06-16 DIAGNOSIS — M50323 Other cervical disc degeneration at C6-C7 level: Secondary | ICD-10-CM

## 2023-06-16 MED ORDER — TIZANIDINE HCL 4 MG PO TABS
4.0000 mg | ORAL_TABLET | Freq: Three times a day (TID) | ORAL | 0 refills | Status: AC
Start: 2023-06-16 — End: 2024-06-15

## 2023-06-16 NOTE — Patient Instructions (Addendum)
It was so nice to see you today. Thank you so much for coming in.   I want to get an MRI of your neck to look into things further. We will get this approved through your insurance and [facility] will call you to schedule the appointment.   After you have the MRI, it takes 10-14 days for me to get the results back. Once I have them, we will call you to schedule a follow up visit with me to review them.   I also sent a prescription for tizanidine to help with muscle spasms. Use only as needed and be careful, this can make you sleepy. Stop the cyclobenzaprine.   I want you to see pain management here in [place] (Dr. Cherylann Ratel) to discuss possible thoracic  injections. They should call you to schedule an appointment or you can call them at [phone].  I want you to see ortho at the here for evaluation of your right knee. They should call you to schedule an appointment or you can call them at [phone].   Please do not hesitate to call if you have any questions or concerns. You can also message me in MyChart.   Drake Leach PA-C [phone]

## 2023-06-23 ENCOUNTER — Ambulatory Visit
Admission: RE | Admit: 2023-06-23 | Discharge: 2023-06-23 | Disposition: A | Discharge status: Home / Self Care | Payer: Self-pay | Source: Ambulatory Visit | Attending: Orthopedic Surgery | Admitting: Orthopedic Surgery

## 2023-06-23 DIAGNOSIS — M47812 Spondylosis without myelopathy or radiculopathy, cervical region: Secondary | ICD-10-CM | POA: Insufficient documentation

## 2023-06-23 DIAGNOSIS — M542 Cervicalgia: Secondary | ICD-10-CM | POA: Insufficient documentation

## 2023-06-23 DIAGNOSIS — M5412 Radiculopathy, cervical region: Secondary | ICD-10-CM | POA: Insufficient documentation

## 2023-06-30 ENCOUNTER — Other Ambulatory Visit (HOSPITAL_BASED_OUTPATIENT_CLINIC_OR_DEPARTMENT_OTHER): Payer: Self-pay | Admitting: Orthopaedic Surgery

## 2023-06-30 ENCOUNTER — Telehealth (HOSPITAL_BASED_OUTPATIENT_CLINIC_OR_DEPARTMENT_OTHER): Payer: Self-pay | Admitting: Orthopaedic Surgery

## 2023-06-30 ENCOUNTER — Ambulatory Visit (HOSPITAL_BASED_OUTPATIENT_CLINIC_OR_DEPARTMENT_OTHER): Payer: Self-pay

## 2023-06-30 DIAGNOSIS — G8929 Other chronic pain: Secondary | ICD-10-CM

## 2023-06-30 NOTE — Telephone Encounter (Signed)
Carmen Soto from patient Dr office sent an inbox message to get the patient scheduled. Patient referral was closed I reopened it for the patient and let her know that the patient needs to get an x-ray before the visit and if the visit needs to be resh then just let office know. Patient does not speak English and an interrupter has been sch.

## 2023-07-01 ENCOUNTER — Encounter: Payer: Self-pay | Admitting: Student in an Organized Health Care Education/Training Program

## 2023-07-01 ENCOUNTER — Ambulatory Visit
Admission: RE | Admit: 2023-07-01 | Discharge: 2023-07-01 | Disposition: A | Discharge status: Home / Self Care | Payer: Self-pay | Source: Ambulatory Visit | Attending: Orthopaedic Surgery | Admitting: Orthopaedic Surgery

## 2023-07-01 ENCOUNTER — Ambulatory Visit
Payer: Self-pay | Attending: Student in an Organized Health Care Education/Training Program | Admitting: Student in an Organized Health Care Education/Training Program

## 2023-07-01 VITALS — BP 146/76 | HR 65 | Temp 97.8°F | Resp 16 | Ht 61.0 in | Wt 148.0 lb

## 2023-07-01 DIAGNOSIS — G894 Chronic pain syndrome: Secondary | ICD-10-CM

## 2023-07-01 DIAGNOSIS — M25561 Pain in right knee: Secondary | ICD-10-CM | POA: Insufficient documentation

## 2023-07-01 DIAGNOSIS — M5414 Radiculopathy, thoracic region: Secondary | ICD-10-CM

## 2023-07-01 DIAGNOSIS — M4810 Ankylosing hyperostosis [Forestier], site unspecified: Secondary | ICD-10-CM

## 2023-07-01 DIAGNOSIS — M47814 Spondylosis without myelopathy or radiculopathy, thoracic region: Secondary | ICD-10-CM

## 2023-07-01 DIAGNOSIS — G8929 Other chronic pain: Secondary | ICD-10-CM | POA: Insufficient documentation

## 2023-07-01 MED ORDER — PREGABALIN 25 MG PO CAPS: ORAL_CAPSULE | ORAL | 0 refills | Status: DC

## 2023-07-01 NOTE — Patient Instructions (Signed)
Sedacin consciente moderada en los adultos Moderate Conscious Sedation, Adult La sedacin es el uso de medicamentos para ayudarle a relajarse y no Financial risk analyst. La sedacin consciente moderada es un tipo de sedacin que disminuye su nivel de alerta habitual. An as puede responder a instrucciones, al tacto o a ambos. Este tipo de sedacin se [place] durante procedimientos mdicos y Barrister's clerk. Es ms leve que la sedacin profunda, que es un tipo de sedacin de la que no es fcil despertarse. Tambin es ms leve que la anestesia general, que es el uso de medicamentos para Engineer, maintenance. La sedacin consciente moderada le permite volver antes a sus actividades habituales. Informe al mdico acerca de lo siguiente: Cualquier alergia que tenga. Todos los [employer] est usando, lo que incluye vitaminas, hierbas, corticoesteroides, gotas oftlmicas, cremas y [address] de [address]. Cualquier problema que usted o los [employer] de su familia hayan tenido con el uso de anestesia. Cualquier problema de la sangre que tenga. Cirugas a las que se haya sometido. Cualquier afeccin mdica que tenga. Si est embarazada o podra estarlo. Consumo reciente de alcohol, tabaco o drogas. Cules son los riesgos? El mdico hablar con usted [employer]. Pueden incluir: Sedacin excesiva. Esto ocurre cuando recibe demasiada cantidad de medicamento. Nuseas o vmitos. Reaccin alrgica a un medicamento. Dificultad para respirar. Si esto ocurre, es posible que se utilice un tubo respiratorio. Se retirar cuando pueda respirar mejor por s mismo. Problemas cardacos. Problemas pulmonares. Delirio de Associate Professor. Es cuando se siente confundido mientras la sedacin desaparece. Esto mejora con [employer]. Qu ocurre antes del procedimiento? Cundo dejar de comer y beber Siga las instrucciones del mdico respecto de lo que puede comer y beber. Pueden incluir: Ocho horas antes del procedimiento Deje de  comer la mayora de los alimentos. No coma carne, alimentos fritos ni alimentos grasos. Consuma solo alimentos livianos, como tostadas o Social worker. Todos los lquidos son aceptables, excepto las bebidas energticas y el alcohol. Seis horas antes del procedimiento Deje de comer. Beba nicamente lquidos transparentes, como agua, jugo de fruta transparente, caf solo, t solo y bebidas deportivas. No consuma bebidas energticas ni alcohol. Dos horas antes del procedimiento Deje de beber todos los lquidos. Es posible que le permitan tomar medicamentos con pequeos sorbos de [place]. Si no sigue las instrucciones del mdico, el procedimiento puede retrasarse o cancelarse. Medicamentos Consulte al mdico si debe hacer o no lo siguiente: Multimedia programmer o suspender los medicamentos que [place] habitualmente. Estos incluyen cualquier medicamento para la diabetes o anticoagulantes que use. Tomar medicamentos como aspirina e ibuprofeno. Estos medicamentos pueden tener un efecto anticoagulante en la [place]. No los tome, a menos que se lo indique el mdico. Usar medicamentos de venta libre, vitaminas, hierbas y suplementos. Pruebas y exmenes Pueden hacerle un examen o pruebas. Pueden extraerle Lauris Poag de [place] o pedirle [place] de [place]. Instrucciones generales No consuma ningn producto que contenga nicotina ni tabaco durante al [employer] las 4 semanas anteriores al procedimiento. Estos productos incluyen cigarrillos, tabaco para Theatre manager y aparatos de vapeo, como los Administrator, Civil Service. Si necesita ayuda para dejar de fumar, consulte al mdico. Si va a marcharse a su casa inmediatamente despus del procedimiento, pdale a un adulto responsable que: Lo lleve a su casa desde el hospital o la clnica. No se le permitir conducir. Lo cuide durante el [employer] indiquen. Qu ocurre durante el procedimiento?  Le administrarn el sedante. Puede administrarse: En forma de pastilla que puede tomar  por va  oral. Tambin puede introducirse en el recto. Como un aerosol por la Clinical cytogeneticist. En forma de una inyeccin en un msculo. En forma de inyeccin en una vena a travs de una va intravenosa. Pueden administrarle oxgeno segn sea necesario. Le controlarn la presin arterial, la frecuencia cardaca y Air traffic controller, y el nivel de oxgeno en la sangre durante el procedimiento. Se realizar el procedimiento mdico o dental. El procedimiento puede variar segn el mdico y el hospital. [place] ocurre despus del procedimiento? Le controlarn la presin arterial, la frecuencia cardaca, la frecuencia respiratoria y Air cabin crew de oxgeno en la sangre hasta que le den el alta del hospital o la clnica. Recibir lquidos por va intravenosa segn sea necesario. No conduzca ni opere maquinaria hasta que el mdico le indique que es seguro [place]. Esta informacin no tiene Theme park manager el consejo del mdico. Asegrese de hacerle al mdico cualquier pregunta que tenga. Document Revised: 04/12/2022 Document Reviewed: 04/12/2022 Elsevier Patient Education  2024 Elsevier Inc. GENERAL RISKS AND COMPLICATIONS  What are the risk, side effects and possible complications? Generally speaking, most procedures are safe.  However, with any procedure there are risks, side effects, and the possibility of complications.  The risks and complications are dependent upon the sites that are lesioned, or the type of nerve block to be performed.  The closer the procedure is to the spine, the more serious the risks are.  Great care is taken when placing the radio frequency needles, block needles or lesioning probes, but sometimes complications can occur. Infection: Any time there is an injection through the skin, there is a risk of infection.  This is why sterile conditions are used for these blocks.  There are four possible types of infection. Localized skin infection. Central Nervous System Infection-This can be in the form  of Meningitis, which can be deadly. Epidural Infections-This can be in the form of an epidural abscess, which can cause pressure inside of the spine, causing compression of the spinal cord with subsequent paralysis. This would require an emergency surgery to decompress, and there are no guarantees that the patient would recover from the paralysis. Discitis-This is an infection of the intervertebral discs.  It occurs in about 1% of discography procedures.  It is difficult to treat and it may lead to surgery.        2. Pain: the needles have to go through skin and soft tissues, will cause soreness.       3. Damage to internal structures:  The nerves to be lesioned may be near blood vessels or    other nerves which can be potentially damaged.       4. Bleeding: Bleeding is more common if the patient is taking blood thinners such as  aspirin, Coumadin, Ticiid, Plavix, etc., or if he/she have some genetic predisposition  such as hemophilia. Bleeding into the spinal canal can cause compression of the spinal  cord with subsequent paralysis.  This would require an emergency surgery to  decompress and there are no guarantees that the patient would recover from the  paralysis.       5. Pneumothorax:  Puncturing of a lung is a possibility, every time a needle is introduced in  the area of the chest or upper back.  Pneumothorax refers to free air around the  collapsed lung(s), inside of the thoracic cavity (chest cavity).  Another two possible  complications related to a similar event would include: Hemothorax and Chylothorax.   These are variations of the  Pneumothorax, where instead of air around the collapsed  lung(s), you may have blood or chyle, respectively.       6. Spinal headaches: They may occur with any procedures in the area of the spine.       7. Persistent CSF (Cerebro-Spinal Fluid) leakage: This is a rare problem, but may occur  with prolonged intrathecal or epidural catheters either due to the formation  of a fistulous  track or a dural tear.       8. Nerve damage: By working so close to the spinal cord, there is always a possibility of  nerve damage, which could be as serious as a permanent spinal cord injury with  paralysis.       9. Death:  Although rare, severe deadly allergic reactions known as "Anaphylactic  reaction" can occur to any of the medications used.      10. Worsening of the symptoms:  We can always make thing worse.  What are the chances of something like this happening? Chances of any of this occuring are extremely low.  By statistics, you have more of a chance of getting killed in a motor vehicle accident: while driving to the hospital than any of the above occurring .  Nevertheless, you should be aware that they are possibilities.  In general, it is similar to taking a shower.  Everybody knows that you can slip, hit your head and get killed.  Does that mean that you should not shower again?  Nevertheless always keep in mind that statistics do not mean anything if you happen to be on the wrong side of them.  Even if a procedure has a 1 (one) in a 1,000,000 (million) chance of going wrong, it you happen to be that one..Also, keep in mind that by statistics, you have more of a chance of having something go wrong when taking medications.  Who should not have this procedure? If you are on a blood thinning medication (e.g. Coumadin, Plavix, see list of "Blood Thinners"), or if you have an active infection going on, you should not have the procedure.  If you are taking any blood thinners, please inform your physician.  How should I prepare for this procedure? Do not eat or drink anything at least six hours prior to the procedure. Bring a driver with you .  It cannot be a taxi. Come accompanied by an adult that can drive you back, and that is strong enough to help you if your legs get weak or numb from the local anesthetic. Take all of your medicines the morning of the procedure with just  enough water to swallow them. If you have diabetes, make sure that you are scheduled to have your procedure done first thing in the morning, whenever possible. If you have diabetes, take only half of your insulin dose and notify our nurse that you have done so as soon as you arrive at the clinic. If you are diabetic, but only take blood sugar pills (oral hypoglycemic), then do not take them on the morning of your procedure.  You may take them after you have had the procedure. Do not take aspirin or any aspirin-containing medications, at least eleven (11) days prior to the procedure.  They may prolong bleeding. Wear loose fitting clothing that may be easy to take off and that you would not mind if it got stained with Betadine or blood. Do not wear any jewelry or perfume Remove any nail coloring.  It will interfere  with some of our monitoring equipment.  NOTE: Remember that this is not meant to be interpreted as a complete list of all possible complications.  Unforeseen problems may occur.  BLOOD THINNERS The following drugs contain aspirin or other products, which can cause increased bleeding during surgery and should not be taken for 2 weeks prior to and 1 week after surgery.  If you should need take something for relief of minor pain, you may take acetaminophen which is found in Tylenol,m Datril, Anacin-3 and Panadol. It is not blood thinner. The products listed below are.  Do not take any of the products listed below in addition to any listed on your instruction sheet.  A.P.C or A.P.C with Codeine Codeine Phosphate Capsules #3 Ibuprofen Ridaura  ABC compound Congesprin Imuran rimadil  Advil Cope Indocin Robaxisal  Alka-Seltzer Effervescent Pain Reliever and Antacid Coricidin or Coricidin-D  Indomethacin Rufen  Alka-Seltzer plus Cold Medicine Cosprin Ketoprofen S-A-C Tablets  Anacin Analgesic Tablets or Capsules Coumadin Korlgesic Salflex  Anacin Extra Strength Analgesic tablets or capsules  CP-2 Tablets Lanoril Salicylate  Anaprox Cuprimine Capsules Levenox Salocol  Anexsia-D Dalteparin Magan Salsalate  Anodynos Darvon compound Magnesium Salicylate Sine-off  Ansaid Dasin Capsules Magsal Sodium Salicylate  Anturane Depen Capsules Marnal Soma  APF Arthritis pain formula Dewitt's Pills Measurin Stanback  Argesic Dia-Gesic Meclofenamic Sulfinpyrazone  Arthritis Bayer Timed Release Aspirin Diclofenac Meclomen Sulindac  Arthritis pain formula Anacin Dicumarol Medipren Supac  Analgesic (Safety coated) Arthralgen Diffunasal Mefanamic Suprofen  Arthritis Strength Bufferin Dihydrocodeine Mepro Compound Suprol  Arthropan liquid Dopirydamole Methcarbomol with Aspirin Synalgos  ASA tablets/Enseals Disalcid Micrainin Tagament  Ascriptin Doan's Midol Talwin  Ascriptin A/D Dolene Mobidin Tanderil  Ascriptin Extra Strength Dolobid Moblgesic Ticlid  Ascriptin with Codeine Doloprin or Doloprin with Codeine Momentum Tolectin  Asperbuf Duoprin Mono-gesic Trendar  Aspergum Duradyne Motrin or Motrin IB Triminicin  Aspirin plain, buffered or enteric coated Durasal Myochrisine Trigesic  Aspirin Suppositories Easprin Nalfon Trillsate  Aspirin with Codeine Ecotrin Regular or Extra Strength Naprosyn Uracel  Atromid-S Efficin Naproxen Ursinus  Auranofin Capsules Elmiron Neocylate Vanquish  Axotal Emagrin Norgesic Verin  Azathioprine Empirin or Empirin with Codeine Normiflo Vitamin E  Azolid Emprazil Nuprin Voltaren  Bayer Aspirin plain, buffered or children's or timed BC Tablets or powders Encaprin Orgaran Warfarin Sodium  Buff-a-Comp Enoxaparin Orudis Zorpin  Buff-a-Comp with Codeine Equegesic Os-Cal-Gesic   Buffaprin Excedrin plain, buffered or Extra Strength Oxalid   Bufferin Arthritis Strength Feldene Oxphenbutazone   Bufferin plain or Extra Strength Feldene Capsules Oxycodone with Aspirin   Bufferin with Codeine Fenoprofen Fenoprofen Pabalate or Pabalate-SF   Buffets II Flogesic Panagesic    Buffinol plain or Extra Strength Florinal or Florinal with Codeine Panwarfarin   Buf-Tabs Flurbiprofen Penicillamine   Butalbital Compound Four-way cold tablets Penicillin   Butazolidin Fragmin Pepto-Bismol   Carbenicillin Geminisyn Percodan   Carna Arthritis Reliever Geopen Persantine   Carprofen Gold's salt Persistin   Chloramphenicol Goody's Phenylbutazone   Chloromycetin Haltrain Piroxlcam   Clmetidine heparin Plaquenil   Cllnoril Hyco-pap Ponstel   Clofibrate Hydroxy chloroquine Propoxyphen         Before stopping any of these medications, be sure to consult the physician who ordered them.  Some, such as Coumadin (Warfarin) are ordered to prevent or treat serious conditions such as "deep thrombosis", "pumonary embolisms", and other heart problems.  The amount of time that you may need off of the medication may also vary with the medication and the reason for which you were taking it.  If you are taking any of these medications, please make sure you notify your pain physician before you undergo any procedures.         Epidural Steroid Injection Patient Information  Description: The epidural space surrounds the nerves as they exit the spinal cord.  In some patients, the nerves can be compressed and inflamed by a bulging disc or a tight spinal canal (spinal stenosis).  By injecting steroids into the epidural space, we can bring irritated nerves into direct contact with a potentially helpful medication.  These steroids act directly on the irritated nerves and can reduce swelling and inflammation which often leads to decreased pain.  Epidural steroids may be injected anywhere along the spine and from the neck to the low back depending upon the location of your pain.   After numbing the skin with local anesthetic (like Novocaine), a small needle is passed into the epidural space slowly.  You may experience a sensation of pressure while this is being done.  The entire block usually last  less than 10 minutes.  Conditions which may be treated by epidural steroids:  Low back and leg pain Neck and arm pain Spinal stenosis Post-laminectomy syndrome Herpes zoster (shingles) pain Pain from compression fractures  Preparation for the injection:  Do not eat any solid food or dairy products within 8 hours of your appointment.  You may drink clear liquids up to 3 hours before appointment.  Clear liquids include water, black coffee, juice or soda.  No milk or cream please. You may take your regular medication, including pain medications, with a sip of water before your appointment  Diabetics should hold regular insulin (if taken separately) and take 1/2 normal NPH dos the morning of the procedure.  Carry some sugar containing items with you to your appointment. A driver must accompany you and be prepared to drive you home after your procedure.  Bring all your current medications with your. An IV may be inserted and sedation may be given at the discretion of the physician.   A blood pressure cuff, EKG and other monitors will often be applied during the procedure.  Some patients may need to have extra oxygen administered for a short period. You will be asked to provide medical information, including your allergies, prior to the procedure.  We must know immediately if you are taking blood thinners (like Coumadin/Warfarin)  Or if you are allergic to IV iodine contrast (dye). We must know if you could possible be pregnant.  Possible side-effects: Bleeding from needle site Infection (rare, may require surgery) Nerve injury (rare) Numbness & tingling (temporary) Difficulty urinating (rare, temporary) Spinal headache ( a headache worse with upright posture) Light -headedness (temporary) Pain at injection site (several days) Decreased blood pressure (temporary) Weakness in arm/leg (temporary) Pressure sensation in back/neck (temporary)  Call if you experience: Fever/chills associated  with headache or increased back/neck pain. Headache worsened by an upright position. New onset weakness or numbness of an extremity below the injection site Hives or difficulty breathing (go to the emergency room) Inflammation or drainage at the infection site Severe back/neck pain Any new symptoms which are concerning to you  Please note:  Although the local anesthetic injected can often make your back or neck feel good for several hours after the injection, the pain will likely return.  It takes 3-7 days for steroids to work in the epidural space.  You may not notice any pain relief for at least that one week.  If effective,  we will often do a series of three injections spaced 3-6 weeks apart to maximally decrease your pain.  After the initial series, we generally will wait several months before considering a repeat injection of the same type.  If you have any questions, please call [phone] [facility]

## 2023-07-01 NOTE — Progress Notes (Signed)
Patient: Carmen Soto  Service Category: E/M  Provider: Edward Jolly, MD  DOB: 04/15/1956  DOS: 07/01/2023  Referring Provider: Gardiner Rhyme  MRN: [id number]  Setting: Ambulatory outpatient  PCP: Preston Fleeting, MD  Type: New Patient  Specialty: Interventional Pain Management    Location: Office  Delivery: Face-to-face     Primary Reason(s) for Visit: Encounter for initial evaluation of one or more chronic problems (new to examiner) potentially causing chronic pain, and posing a threat to normal musculoskeletal function. (Level of risk: High) CC: Back Pain (Mid, left)  HPI  Carmen Soto is a 67 y.o. year old, female patient, who comes for the first time to our practice referred by Drake Leach, PA-C for our initial evaluation of her chronic pain. She has Radicular pain of thoracic region (left); DISH (diffuse idiopathic skeletal hyperostosis); Arthropathy of thoracic facet joint; and Chronic pain syndrome on their problem list. Today she comes in for evaluation of her Back Pain (Mid, left)  Pain Assessment: Location: Mid, Left Back Radiating: left flank Onset: More than a month ago Duration: Chronic pain Quality: Other (Comment) (electric) Severity: 7 /10 (subjective, self-reported pain score)  Effect on ADL: limits daily activities Timing: Constant Modifying factors: nothing BP: (!) 146/76  HR: 65  Onset and Duration: Gradual and Present longer than 3 months Cause of pain:  accident in 2016 Severity: Getting worse, No change since onset, NAS-11 at its worse: 10/10, NAS-11 at its best: 6/10, NAS-11 now: 5/10, and NAS-11 on the average: 7/10 Timing:  always Aggravating Factors: Bending, Climbing, Kneeling, Lifiting, Motion, Prolonged sitting, Prolonged standing, Squatting, Stooping , Walking, Walking uphill, Walking downhill, and Working Alleviating Factors: Bending, Stretching, Cold packs, Hot packs, Lying down, Medications, Nerve blocks, Resting, Sitting, Sleeping,  Relaxation therapy, and Warm showers or baths Associated Problems: Constipation, Day-time cramps, Night-time cramps, Depression, Dizziness, Fatigue, Inability to concentrate, Nausea, Numbness, Sadness, Spasms, Tingling, Weakness, Pain that wakes patient up, and Pain that does not allow patient to sleep Quality of Pain: Aching Previous Examinations or Tests: MRI scan, Nerve block, and Spinal tap Previous Treatments: Epidural steroid injections and Physical Therapy  Carmen Soto is being evaluated for possible interventional pain management therapies for the treatment of her chronic pain.   Spanish interpreter present.  Patient presents with a chief complaint of left-sided thoracic pain that radiates to her lateral thoracic region to her mid axillary line.  She states that this is been going on for over 3 months.  No inciting or traumatic event.  She has been evaluated by neurosurgery.  She is being referred here to consider thoracic spinal injections.  Of note she has done physical therapy in the past without any benefit.  Patient is not interested in repeating.  She has been utilizing Zanaflex to help with muscle spasms.  She has tried gabapentin in the past which was not effective.  She has not tried pregabalin.  Of note, she states that approximately 3 years ago she was seen at [facility] orthopedics for left low back and radiating left leg pain.  She states that she received a left L4-L5 transforaminal ESI at that time which did help out with her pain for approximately 3 months.  She confirms that her pain is higher up in her thoracic region and radiates out laterally.     Meds   Current Outpatient Medications:    atorvastatin (LIPITOR) 40 MG tablet, Take 40 mg by mouth., Disp: , Rfl:    levothyroxine (SYNTHROID)  50 MCG tablet, Take 50 mcg by mouth daily., Disp: , Rfl:    lisinopril (ZESTRIL) 40 MG tablet, Take 40 mg by mouth daily., Disp: , Rfl:    pregabalin (LYRICA) 25 MG capsule, Take 1  capsule (25 mg total) by mouth 3 (three) times daily for 15 days, THEN 2 capsules (50 mg total) 3 (three) times daily., Disp: 225 capsule, Rfl: 0   tiZANidine (ZANAFLEX) 4 MG tablet, Take 1 tablet (4 mg total) by mouth 3 (three) times daily., Disp: 60 tablet, Rfl: 0   lidocaine (LIDODERM) 5 %, Place 1 patch onto the skin every 12 (twelve) hours. Remove & Discard patch within 12 hours or as directed by MD (Patient not taking: Reported on 07/01/2023), Disp: 10 patch, Rfl: 1  Imaging Review   MR THORACIC SPINE WO CONTRAST  Narrative CLINICAL DATA:  Thoracic spondylosis. Thoracic radiculopathy. Chronic mid back pain. Bilateral arm and leg pain.  EXAM: MRI THORACIC SPINE WITHOUT CONTRAST  TECHNIQUE: Multiplanar, multisequence MR imaging of the thoracic spine was performed. No intravenous contrast was administered.  COMPARISON:  CT thoracic spine 05/04/2023  FINDINGS: Alignment: Straightening/mild reversal of the normal cervical lordosis with trace retrolisthesis of C5 on C6. Trace anterolisthesis of T2 on T3. Mild thoracic scoliosis.  Vertebrae: No fracture, suspicious marrow lesion, or evidence of discitis. Multilevel bridging anterior vertebral osteophytes in the thoracic spine as shown on the prior CT suggestive of diffuse idiopathic skeletal hyperostosis. Facet ankylosis at T5-6.  Cord:  Normal signal.  Paraspinal and other soft tissues: Unremarkable.  Disc levels:  Incompletely evaluated cervical disc degeneration, with disc bulging at C5-6 resulting in mild spinal stenosis. Mild-to-moderate right neural foraminal stenosis at C6-7.  Shallow right paracentral disc protrusion at T7-8 without spinal stenosis or spinal cord mass effect. Moderate multilevel facet arthrosis contributing to mild multilevel neural foraminal stenosis.  IMPRESSION: 1. Thoracic spondylosis and facet arthrosis without spinal stenosis or compressive neural foraminal stenosis. 2. Mild spinal  stenosis and mild-to-moderate right neural foraminal stenosis at C6-7.   Electronically Signed By: Sebastian Ache M.D. On: 06/07/2023 12:49   DG Thoracic Spine 2 View  Narrative CLINICAL DATA:  Back and chest wall pain, initial encounter  EXAM: THORACIC SPINE 2 VIEWS  COMPARISON:  None Available.  FINDINGS: Vertebral body height is well maintained. Osteophytic changes are seen. Pedicles are within normal limits. No paraspinal mass is noted.  IMPRESSION: Degenerative change without acute abnormality.   Electronically Signed By: Alcide Clever M.D. On: 05/01/2023 03:21  Complexity Note: Imaging results reviewed.                         ROS  Cardiovascular: No reported cardiovascular signs or symptoms such as High blood pressure, coronary artery disease, abnormal heart rate or rhythm, heart attack, blood thinner therapy or heart weakness and/or failure Pulmonary or Respiratory: Smoking Neurological: No reported neurological signs or symptoms such as seizures, abnormal skin sensations, urinary and/or fecal incontinence, being born with an abnormal open spine and/or a tethered spinal cord Psychological-Psychiatric: Depressed Gastrointestinal: No reported gastrointestinal signs or symptoms such as vomiting or evacuating blood, reflux, heartburn, alternating episodes of diarrhea and constipation, inflamed or scarred liver, or pancreas or irrregular and/or infrequent bowel movements Genitourinary: No reported renal or genitourinary signs or symptoms such as difficulty voiding or producing urine, peeing blood, non-functioning kidney, kidney stones, difficulty emptying the bladder, difficulty controlling the flow of urine, or chronic kidney disease Hematological: No reported hematological signs  or symptoms such as prolonged bleeding, low or poor functioning platelets, bruising or bleeding easily, hereditary bleeding problems, low energy levels due to low hemoglobin or being  anemic Endocrine: High blood sugar requiring insulin (IDDM) Rheumatologic: No reported rheumatological signs and symptoms such as fatigue, joint pain, tenderness, swelling, redness, heat, stiffness, decreased range of motion, with or without associated rash Musculoskeletal: Negative for myasthenia gravis, muscular dystrophy, multiple sclerosis or malignant hyperthermia Work History: Retired  Allergies  Carmen Soto is allergic to tramadol.  Laboratory Chemistry Profile   Renal Lab Results  Component Value Date   BUN 23 05/04/2023   CREATININE 0.73 05/04/2023   GFRAA >60 10/16/2019   GFRNONAA >60 05/04/2023   PROTEINUR NEGATIVE 10/16/2019     Electrolytes Lab Results  Component Value Date   NA 140 05/04/2023   K 3.8 05/04/2023   CL 108 05/04/2023   CALCIUM 9.3 05/04/2023     Hepatic Lab Results  Component Value Date   AST 42 (H) 05/04/2023   ALT 40 05/04/2023   ALBUMIN 3.5 05/04/2023   ALKPHOS 139 (H) 05/04/2023   LIPASE 29 10/16/2019     ID No results found for: "LYMEIGGIGMAB", "HIV", "SARSCOV2NAA", "STAPHAUREUS", "MRSAPCR", "HCVAB", "PREGTESTUR", "RMSFIGG", "QFVRPH1IGG", "QFVRPH2IGG"   Bone No results found for: "VD25OH", "VD125OH2TOT", "WU9811BJ4", "NW2956OZ3", "25OHVITD1", "25OHVITD2", "25OHVITD3", "TESTOFREE", "TESTOSTERONE"   Endocrine Lab Results  Component Value Date   GLUCOSE 122 (H) 05/04/2023   GLUCOSEU NEGATIVE 10/16/2019   HGBA1C 5.9 01/07/2013     Neuropathy Lab Results  Component Value Date   HGBA1C 5.9 01/07/2013     CNS No results found for: "COLORCSF", "APPEARCSF", "RBCCOUNTCSF", "WBCCSF", "POLYSCSF", "LYMPHSCSF", "EOSCSF", "PROTEINCSF", "GLUCCSF", "JCVIRUS", "CSFOLI", "IGGCSF", "LABACHR", "ACETBL"   Inflammation (CRP: Acute  ESR: Chronic) No results found for: "CRP", "ESRSEDRATE", "LATICACIDVEN"   Rheumatology No results found for: "RF", "ANA", "LABURIC", "URICUR", "LYMEIGGIGMAB", "LYMEABIGMQN", "HLAB27"   Coagulation Lab Results   Component Value Date   INR 0.9 10/17/2013   LABPROT 12.5 10/17/2013   PLT 262 05/04/2023     Cardiovascular Lab Results  Component Value Date   CKTOTAL 193 10/17/2013   CKMB 3.4 10/17/2013   TROPONINI < 0.02 10/17/2013   HGB 12.6 05/04/2023   HCT 39.0 05/04/2023     Screening No results found for: "SARSCOV2NAA", "COVIDSOURCE", "STAPHAUREUS", "MRSAPCR", "HCVAB", "HIV", "PREGTESTUR"   Cancer No results found for: "CEA", "CA125", "LABCA2"   Allergens No results found for: "ALMOND", "APPLE", "ASPARAGUS", "AVOCADO", "BANANA", "BARLEY", "BASIL", "BAYLEAF", "GREENBEAN", "LIMABEAN", "WHITEBEAN", "BEEFIGE", "REDBEET", "BLUEBERRY", "BROCCOLI", "CABBAGE", "MELON", "CARROT", "CASEIN", "CASHEWNUT", "CAULIFLOWER", "CELERY"     Note: Lab results reviewed.  PFSH  Drug: Carmen Soto  reports no history of drug use. Alcohol:  reports no history of alcohol use. Tobacco:  reports that she has never smoked. She has never used smokeless tobacco. Medical:  has a past medical history of Arthritis, Back pain, Hypercholesteremia, and Hypertension. Family: family history is not on file.  Past Surgical History:  Procedure Laterality Date   ABDOMINAL HYSTERECTOMY     CESAREAN SECTION     FOREIGN BODY REMOVAL ESOPHAGEAL N/A 01/28/2017   Procedure: REMOVAL FOREIGN BODY ESOPHAGEAL;  Surgeon: Linus Salmons, MD;  Location: [facility] ORS;  Service: ENT;  Laterality: N/A;   OOPHORECTOMY     Active Ambulatory Problems    Diagnosis Date Noted   Radicular pain of thoracic region (left) 07/01/2023   DISH (diffuse idiopathic skeletal hyperostosis) 07/01/2023   Arthropathy of thoracic facet joint 07/01/2023   Chronic pain syndrome  07/01/2023   Resolved Ambulatory Problems    Diagnosis Date Noted   No Resolved Ambulatory Problems   Past Medical History:  Diagnosis Date   Arthritis    Back pain    Hypercholesteremia    Hypertension    Constitutional Exam  General appearance: Well nourished, well  developed, and well hydrated. In no apparent acute distress Vitals:   07/01/23 0857  BP: (!) 146/76  Pulse: 65  Resp: 16  Temp: 97.8 F (36.6 C)  SpO2: 100%  Weight: 148 lb (67.1 kg)  Height: 5\' 1"  (1.549 m)   BMI Assessment: Estimated body mass index is 27.96 kg/m as calculated from the following:   Height as of this encounter: 5\' 1"  (1.549 m).   Weight as of this encounter: 148 lb (67.1 kg).  BMI interpretation table: BMI level Category Range association with higher incidence of chronic pain  <18 kg/m2 Underweight   18.5-24.9 kg/m2 Ideal body weight   25-29.9 kg/m2 Overweight Increased incidence by 20%  30-34.9 kg/m2 Obese (Class I) Increased incidence by 68%  35-39.9 kg/m2 Severe obesity (Class II) Increased incidence by 136%  >40 kg/m2 Extreme obesity (Class III) Increased incidence by 254%   Patient's current BMI Ideal Body weight  Body mass index is 27.96 kg/m. Ideal body weight: 47.8 kg (105 lb 6.1 oz) Adjusted ideal body weight: 55.5 kg (122 lb 6.8 oz)   BMI Readings from Last 4 Encounters:  07/01/23 27.96 kg/m  06/16/23 27.62 kg/m  05/16/23 27.73 kg/m  05/04/23 27.45 kg/m   Wt Readings from Last 4 Encounters:  07/01/23 148 lb (67.1 kg)  06/16/23 151 lb (68.5 kg)  05/16/23 151 lb 9.6 oz (68.8 kg)  05/04/23 154 lb 15.7 oz (70.3 kg)    Psych/Mental status: Alert, oriented x 3 (person, place, & time)       Eyes: PERLA Respiratory: No evidence of acute respiratory distress  Thoracic Spine Area Exam  Skin & Axial Inspection: No masses, redness, or swelling Alignment: Symmetrical Functional ROM: Pain restricted ROM to the left Stability: No instability detected Muscle Tone/Strength: Functionally intact. No obvious neuro-muscular anomalies detected. Sensory (Neurological): Dermatomal pain pattern left-sided Muscle strength & Tone: No palpable anomalies  Lumbar Spine Area Exam  Skin & Axial Inspection: No masses, redness, or swelling Alignment:  Symmetrical Functional ROM: Pain restricted ROM       Stability: No instability detected Muscle Tone/Strength: Functionally intact. No obvious neuro-muscular anomalies detected. Sensory (Neurological): Dermatomal pain pattern left    Gait & Posture Assessment  Ambulation: Unassisted Gait: Relatively normal for age and body habitus Posture: WNL  Lower Extremity Exam    Side: Right lower extremity  Side: Left lower extremity  Stability: No instability observed          Stability: No instability observed          Skin & Extremity Inspection: Skin color, temperature, and hair growth are WNL. No peripheral edema or cyanosis. No masses, redness, swelling, asymmetry, or associated skin lesions. No contractures.  Skin & Extremity Inspection: Skin color, temperature, and hair growth are WNL. No peripheral edema or cyanosis. No masses, redness, swelling, asymmetry, or associated skin lesions. No contractures.  Functional ROM: Unrestricted ROM                  Functional ROM: Unrestricted ROM                  Muscle Tone/Strength: Functionally intact. No obvious neuro-muscular anomalies detected.  Muscle Tone/Strength:  Functionally intact. No obvious neuro-muscular anomalies detected.  Sensory (Neurological): Unimpaired        Sensory (Neurological): Dermatomal pain pattern        DTR: Patellar: deferred today Achilles: deferred today Plantar: deferred today  DTR: Patellar: deferred today Achilles: deferred today Plantar: deferred today  Palpation: No palpable anomalies  Palpation: No palpable anomalies    Assessment  Primary Diagnosis & Pertinent Problem List: The primary encounter diagnosis was Radicular pain of thoracic region (left). Diagnoses of DISH (diffuse idiopathic skeletal hyperostosis), Arthropathy of thoracic facet joint, and Chronic pain syndrome were also pertinent to this visit.  Visit Diagnosis (New problems to examiner): 1. Radicular pain of thoracic region (left)   2. DISH  (diffuse idiopathic skeletal hyperostosis)   3. Arthropathy of thoracic facet joint   4. Chronic pain syndrome    Plan of Care (Initial workup plan)  I reviewed the patient's thoracic MRI with her in detail.  I also reviewed her previous injection history including her left L4-L5 transforaminal ESI that she had at [facility].  I recommend that she start Lyrica as below, titration instructions provided.  Given that her pain is radiating in a band/like pattern, we discussed a thoracic epidural steroid injection.  She could also be a candidate for thoracic facet medial branch nerve blocks given presence of thoracic spondylosis and degenerative disc disease.  I would expect her pain to be more focal and not radiate out if this was her primary pain generator.  She also endorses left low back pain and radiating left leg pain.  We can consider repeating the left L4-L5 transforaminal ESI in the future like she had done at [facility] in the past.  Lab Orders         Compliance Drug Analysis, Ur      Procedure Orders         Thoracic Epidural Injection     Pharmacotherapy (current): Medications ordered:  Meds ordered this encounter  Medications   pregabalin (LYRICA) 25 MG capsule    Sig: Take 1 capsule (25 mg total) by mouth 3 (three) times daily for 15 days, THEN 2 capsules (50 mg total) 3 (three) times daily.    Dispense:  225 capsule    Refill:  0   Medications administered during this visit: Carmen Soto had no medications administered during this visit.   Analgesic Pharmacotherapy:  Opioid Analgesics: For patients currently taking or requesting to take opioid analgesics, in accordance with [facility] Guidelines, we will assess their risks and indications for the use of these substances. After completing our evaluation, we may offer recommendations, but we no longer take patients for medication management. The prescribing physician will ultimately decide, based on his/her training  and level of comfort whether to adopt any of the recommendations, including whether or not to prescribe such medicines.  Membrane stabilizer:  Lyrica as above.  No benefit with gabapentin.  Future considerations include TCA, Cymbalta  Muscle relaxant:  Has tried Flexeril in the past.  Currently on tizanidine  NSAID: To be determined at a later time  Other analgesic(s): To be determined at a later time     Provider-requested follow-up: Return in about 4 weeks (around 07/29/2023) for Left Thoracic ESI.  Future Appointments  Date Time Provider Department Center  07/03/2023 10:45 AM [facility] PROVIDER [facility] None  07/28/2023  9:40 AM Edward Jolly, MD [facility] None    Duration of encounter: .  Total time on encounter, as per AMA guidelines  included both the face-to-face and non-face-to-face time personally spent by the physician and/or other qualified health care professional(s) on the day of the encounter (includes time in activities that require the physician or other qualified health care professional and does not include time in activities normally performed by clinical staff). Physician's time may include the following activities when performed: Preparing to see the patient (e.g., pre-charting review of records, searching for previously ordered imaging, lab work, and nerve conduction tests) Review of prior analgesic pharmacotherapies. Reviewing PMP Interpreting ordered tests (e.g., lab work, imaging, nerve conduction tests) Performing post-procedure evaluations, including interpretation of diagnostic procedures Obtaining and/or reviewing separately obtained history Performing a medically appropriate examination and/or evaluation Counseling and educating the patient/family/caregiver Ordering medications, tests, or procedures Referring and communicating with other health care professionals (when not separately reported) Documenting clinical information in the electronic or  other health record Independently interpreting results (not separately reported) and communicating results to the patient/ family/caregiver Care coordination (not separately reported)  Note by: Edward Jolly, MD (TTS technology used. I apologize for any typographical errors that were not detected and corrected.) Date: 07/01/2023; Time: 10:54 AM

## 2023-07-01 NOTE — Progress Notes (Signed)
Safety precautions to be maintained throughout the outpatient stay will include: orient to surroundings, keep bed in low position, maintain call bell within reach at all times, provide assistance with transfer out of bed and ambulation.  

## 2023-07-03 ENCOUNTER — Ambulatory Visit (INDEPENDENT_AMBULATORY_CARE_PROVIDER_SITE_OTHER): Payer: Self-pay | Admitting: Orthopaedic Surgery

## 2023-07-03 DIAGNOSIS — M1711 Unilateral primary osteoarthritis, right knee: Secondary | ICD-10-CM

## 2023-07-03 MED ORDER — LIDOCAINE HCL 1 % IJ SOLN
4.0000 mL | INTRAMUSCULAR | Status: AC | PRN
Start: 2023-07-03 — End: 2023-07-03
  Administered 2023-07-03: 4 mL

## 2023-07-03 MED ORDER — TRIAMCINOLONE ACETONIDE 40 MG/ML IJ SUSP
80.0000 mg | INTRAMUSCULAR | Status: AC | PRN
Start: 2023-07-03 — End: 2023-07-03
  Administered 2023-07-03: 80 mg via INTRA_ARTICULAR

## 2023-07-03 NOTE — Progress Notes (Signed)
Chief Complaint: Right knee pain     History of Present Illness:    Carmen Soto is a 67 y.o. female presents today with right knee pain she has been experiencing for the last 6 months patient not have any known injury.  She is currently not working.  She has pain with most activities of daily living or walking.  She has not had any previous injections or physical therapy.  She does occasionally have some clicking in the knee    Surgical History:   None  PMH/PSH/Family History/Social History/Meds/Allergies:    Past Medical History:  Diagnosis Date  . Arthritis   . Back pain   . Hypercholesteremia   . Hypertension    Past Surgical History:  Procedure Laterality Date  . ABDOMINAL HYSTERECTOMY    . CESAREAN SECTION    . FOREIGN BODY REMOVAL ESOPHAGEAL N/A 01/28/2017   Procedure: REMOVAL FOREIGN BODY ESOPHAGEAL;  Surgeon: Linus Salmons, MD;  Location: [facility] ORS;  Service: ENT;  Laterality: N/A;  . OOPHORECTOMY     Social History   Socioeconomic History  . Marital status: Single    Spouse name: Not on file  . Number of children: Not on file  . Years of education: Not on file  . Highest education level: Not on file  Occupational History  . Not on file  Tobacco Use  . Smoking status: Never  . Smokeless tobacco: Never  Substance and Sexual Activity  . Alcohol use: No  . Drug use: No  . Sexual activity: Not on file  Other Topics Concern  . Not on file  Social History Narrative  . Not on file   Social Determinants of Health   Financial Resource Strain: Not on file  Food Insecurity: Not on file  Transportation Needs: Not on file  Physical Activity: Not on file  Stress: Not on file  Social Connections: Not on file   No family history on file. Allergies  Allergen Reactions  . Tramadol Other (See Comments) and Palpitations    Causes High blood pressure   Current Outpatient Medications  Medication Sig Dispense Refill   . atorvastatin (LIPITOR) 40 MG tablet Take 40 mg by mouth.    . levothyroxine (SYNTHROID) 50 MCG tablet Take 50 mcg by mouth daily.    Marland Kitchen lidocaine (LIDODERM) 5 % Place 1 patch onto the skin every 12 (twelve) hours. Remove & Discard patch within 12 hours or as directed by MD (Patient not taking: Reported on 07/01/2023) 10 patch 1  . lisinopril (ZESTRIL) 40 MG tablet Take 40 mg by mouth daily.    . pregabalin (LYRICA) 25 MG capsule Take 1 capsule (25 mg total) by mouth 3 (three) times daily for 15 days, THEN 2 capsules (50 mg total) 3 (three) times daily. 225 capsule 0  . tiZANidine (ZANAFLEX) 4 MG tablet Take 1 tablet (4 mg total) by mouth 3 (three) times daily. 60 tablet 0   No current facility-administered medications for this visit.   No results found.  Review of Systems:   A ROS was performed including pertinent positives and negatives as documented in the HPI.  Physical Exam :   Constitutional: NAD and appears stated age Neurological: Alert and oriented Psych: Appropriate affect and cooperative There were no vitals taken for this visit.   Comprehensive Musculoskeletal  Exam:    Tricompartmental tenderness to palpation with tenderness behind the posterior knee.  There is a small effusion.  There is crepitus with range of motion otherwise distal neurosensory exam is intact  Imaging:   Xray (4 views right knee): Mild to moderate tricompartmental osteoarthritis    I personally reviewed and interpreted the radiographs.   Assessment:   67 y.o. 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  At today's visit I did describe that I overall would recommend a right knee ultrasound-guided injection to hopefully get her some relief.  She like to proceed with this today.  Plan :    -Right knee ultrasound-guided injection informed verbal consent obtained    Procedure Note  Patient: Carmen Soto             Date of Birth: [birth year]           MRN:  [id number]             Visit Date: 07/03/2023  Procedures: Visit Diagnoses: No diagnosis found.  Large Joint Inj: R knee on 07/03/2023 10:44 AM Indications: pain Details: 22 G 1.5 in needle, ultrasound-guided anterior approach  Arthrogram: No  Medications: 4 mL lidocaine 1 %; 80 mg triamcinolone acetonide 40 MG/ML Outcome: tolerated well, no immediate complications Procedure, treatment alternatives, risks and benefits explained, specific risks discussed. Consent was given by the patient. Immediately prior to procedure a time out was called to verify the correct patient, procedure, equipment, support staff and site/side marked as required. Patient was prepped and draped in the usual sterile fashion.        I personally saw and evaluated the patient, and participated in the management and treatment plan.  Huel Cote, MD Attending Physician, Orthopedic Surgery  This document was dictated using Dragon voice recognition software. A reasonable attempt at proof reading has been made to minimize errors.

## 2023-07-08 LAB — COMPLIANCE DRUG ANALYSIS, UR

## 2023-07-25 NOTE — Progress Notes (Addendum)
Referring Physician:  No referring provider defined for this encounter.  Primary Physician:  Preston Fleeting, MD  Interpreter used as patient speaks spanish.   History of Present Illness: Carmen Soto has a history of HTN, hypercholesterolemia.   Last seen by me on 06/16/23 for thoracic pain with known thoracic spondylosis. Thoracic MRI showed DDD at C5-C7 with disc bulging and mild stenosis C5-C6. Cannot be fully evaluated.   She was referred to Dr. Cherylann Ratel for possible thoracic injections. Sent to ortho for right knee pain.   Dr. Cherylann Ratel started her on lyrica and scheduled her for left thoracic ESI. She had right knee injection by Dr. Steward Drone on 07/03/23. She had relief with this as well.   MRI of cervical spine ordered as she was having neck and left arm pain.   She is here to review her cervical MRI.   She has seen improvement in her mid back pain with lyrica.   She also has constant neck pain with left arm pain to her hand. She has no numbness and tingling in her left arm. She feels like the left arm is heavy and she feels like she has weakness. She has pain with moving the left arm/shoulder.   Bowel/Bladder Dysfunction: none  Conservative measures:  Physical therapy: she did 2 years ago with no relief  Multimodal medical therapy including regular antiinflammatories: lidoderm, robaxin, prednisone, percocet, baclofen, neurontin, flexeril, lyrica Injections: No epidural steroid injections  Past Surgery: No spinal surgery  Wilnette Kales Shetara Berroa has no symptoms of cervical myelopathy.  The symptoms are causing a significant impact on the patient's life.   Review of Systems:  A 10 point review of systems is negative, except for the pertinent positives and negatives detailed in the HPI.  Past Medical History: Past Medical History:  Diagnosis Date   Arthritis    Back pain    Hypercholesteremia    Hypertension     Past Surgical History: Past Surgical  History:  Procedure Laterality Date   ABDOMINAL HYSTERECTOMY     CESAREAN SECTION     FOREIGN BODY REMOVAL ESOPHAGEAL N/A 01/28/2017   Procedure: REMOVAL FOREIGN BODY ESOPHAGEAL;  Surgeon: Linus Salmons, MD;  Location: [facility] ORS;  Service: ENT;  Laterality: N/A;   OOPHORECTOMY      Allergies: Allergies as of 07/31/2023 - Review Complete 07/01/2023  Allergen Reaction Noted   Tramadol Other (See Comments) and Palpitations 01/17/2020    Medications: Outpatient Encounter Medications as of 07/31/2023  Medication Sig   atorvastatin (LIPITOR) 40 MG tablet Take 40 mg by mouth.   levothyroxine (SYNTHROID) 50 MCG tablet Take 50 mcg by mouth daily.   lidocaine (LIDODERM) 5 % Place 1 patch onto the skin every 12 (twelve) hours. Remove & Discard patch within 12 hours or as directed by MD (Patient not taking: Reported on 07/01/2023)   lisinopril (ZESTRIL) 40 MG tablet Take 40 mg by mouth daily.   pregabalin (LYRICA) 25 MG capsule Take 1 capsule (25 mg total) by mouth 3 (three) times daily for 15 days, THEN 2 capsules (50 mg total) 3 (three) times daily.   tiZANidine (ZANAFLEX) 4 MG tablet Take 1 tablet (4 mg total) by mouth 3 (three) times daily.   No facility-administered encounter medications on file as of 07/31/2023.    Social History: Social History   Tobacco Use   Smoking status: Never   Smokeless tobacco: Never  Substance Use Topics   Alcohol use: No   Drug use: No  Family Medical History: No family history on file.  Physical Examination: There were no vitals filed for this visit.    Awake, alert, oriented to person, place, and time.  Speech is clear and fluent. Fund of knowledge is appropriate.   Cranial Nerves: Pupils equal round and reactive to light.  Facial tone is symmetric.    She has no lower posterior cervical tenderness. She has diffuse left shoulder tenderness.   No abnormal lesions on exposed skin.   Strength: Side Biceps Triceps Deltoid Interossei Grip  Wrist Ext. Wrist Flex.  R 5 5 5 5 5 5 5   L 5 5 5 5 5 5 5    Side Iliopsoas Quads Hamstring PF DF EHL  R 5 5 5 5 5 5   L 5 5 5 5 5 5     Reflexes are 2+ and symmetric at the biceps, brachioradialis, patella and achilles.   Hoffman's is absent.  Clonus is not present.   Bilateral upper and lower extremity sensation is intact to light touch, but left upper extremity sensation is diminished compared to right.   She has limited painful ROM of her left shoulder. She has pain with IR/ER of left shoulder along with pain with stress of rotator cuff.   Gait is normal.    Medical Decision Making  Imaging: Cervical MRI dated 06/23/23:  FINDINGS: Alignment: Mild C4-5 anterolisthesis.   Vertebrae: No fracture, evidence of discitis, or bone lesion.   Cord: Normal signal and morphology.   Posterior Fossa, vertebral arteries, paraspinal tissues: Negative.   Disc levels:   C2-3: Mild facet spurring   C3-4: Facet spurring on the left with ankylosis.   C4-5: Degenerative facet spurring with anterolisthesis and right-sided ankylosis. Patent canal and foramina   C5-6: Disc collapse with uncovertebral ridging on the right more than left. Circumferential disc bulging. There is mild degenerative facet spurring on both sides. Biforaminal impingement   C6-7: Disc narrowing and bulging with uncovertebral ridging and facet spurring asymmetric to the left. Moderate right foraminal narrowing.   C7-T1:Unremarkable.   IMPRESSION: 1. Generalized cervical spine degeneration as described. Ankylosis of degenerated facets at C3-4 and C4-5. 2. Foraminal impingement bilaterally at C5-6 and on the right at C6-7. 3. Up to mild spinal stenosis at C5-6.     Electronically Signed   By: Tiburcio Pea M.D.   On: 07/14/2023 09:22  I have personally reviewed the images and agree with the above interpretation.  Assessment and Plan: [name] is a pleasant 67 y.o. female who has constant neck pain  with left arm pain to her hand. She has pain in left shoulder that is worse with moving the arm. Arm feels heavy.   She has known cervical spondylosis with DDD C5-C6 and bilateral foraminal stenosis. Also with DDD C6-C6 with moderate right foraminal stenosis.  Neck pain is likely due to underlying spondylosis.  Left arm pain may be from foraminal stenosis at C5-C6, however, I think a lot of her pain is left shoulder mediated.  She has limited and painful range of motion of the left shoulder.  She states that her primary complaint is her neck pain and stiffness.   Thoracic pain is  much better since starting lyrica.  Right knee pain is much better after injection.  Treatment options discussed with patient and following plan made:   - Referral to pain management (Lateef) to discuss possible cervical injections.  She wants to wait and see him after the first of the year. -Discussed  and recommended referral back to Ortho for left shoulder injection.  She declines. - We discussed PT for her cervical spine. She had no relief with PT 2 years ago and declines revisiting it.  - Continue on Lyrica per Dr. Cherylann Ratel.  - Follow-up with me at the end of February.  She will need an interpreter.   I spent a total of 20 minutes in face-to-face and non-face-to-face activities related to this patient's care today including review of outside records, review of imaging, review of symptoms, physical exam, discussion of differential diagnosis, discussion of treatment options, and documentation.   Drake Leach PA-C Dept. of Neurosurgery

## 2023-07-28 ENCOUNTER — Ambulatory Visit
Payer: Self-pay | Attending: Student in an Organized Health Care Education/Training Program | Admitting: Student in an Organized Health Care Education/Training Program

## 2023-07-28 ENCOUNTER — Encounter: Payer: Self-pay | Admitting: Student in an Organized Health Care Education/Training Program

## 2023-07-28 VITALS — BP 145/78 | HR 62 | Temp 97.0°F | Resp 14 | Ht 62.0 in | Wt 148.0 lb

## 2023-07-28 DIAGNOSIS — M4810 Ankylosing hyperostosis [Forestier], site unspecified: Secondary | ICD-10-CM | POA: Insufficient documentation

## 2023-07-28 DIAGNOSIS — M5414 Radiculopathy, thoracic region: Secondary | ICD-10-CM | POA: Insufficient documentation

## 2023-07-28 DIAGNOSIS — G894 Chronic pain syndrome: Secondary | ICD-10-CM | POA: Insufficient documentation

## 2023-07-28 MED ORDER — PREGABALIN 25 MG PO CAPS: 25.0000 mg | ORAL_CAPSULE | Freq: Three times a day (TID) | ORAL | 5 refills | Status: AC

## 2023-07-28 NOTE — Patient Instructions (Signed)
Call when you receive your last refill of Lyrica to schedule an appt.

## 2023-07-28 NOTE — Progress Notes (Signed)
PROVIDER NOTE: Information contained herein reflects review and annotations entered in association with encounter. Interpretation of such information and data should be left to medically-trained personnel. Information provided to patient can be located elsewhere in the medical record under "Patient Instructions". Document created using STT-dictation technology, any transcriptional errors that may result from process are unintentional.    Patient: Carmen Soto  Service Category: E/M  Provider: Edward Jolly, MD  DOB: 1956/01/07  DOS: 07/28/2023  Referring Provider: Preston Fleeting*  MRN: [id number]  Specialty: Interventional Pain Management  PCP: Preston Fleeting, MD  Type: Established Patient  Setting: Ambulatory outpatient    Location: Office  Delivery: Face-to-face     HPI  Carmen Soto, a 67 y.o. year old female, is here today because of her DISH (diffuse idiopathic skeletal hyperostosis) [M48.10]. Carmen Soto's primary complain today is Back Pain (upper)   Pain Assessment: Severity of Chronic pain is reported as a 5 /10. Location: Back Upper/denies. Onset: More than a month ago. Quality: Other (Comment) (electric shock). Timing: Constant. Modifying factor(s): Tylenol, Lyrica. Vitals:  height is 5\' 2"  (1.575 m) and weight is 148 lb (67.1 kg). Her temporal temperature is 97 F (36.1 C) (abnormal). Her blood pressure is 145/78 (abnormal) and her pulse is 62. Her respiration is 14 and oxygen saturation is 100%.  BMI: Estimated body mass index is 27.07 kg/m as calculated from the following:   Height as of this encounter: 5\' 2"  (1.575 m).   Weight as of this encounter: 148 lb (67.1 kg). Last encounter: 07/01/2023. Last procedure: Visit date not found.  Reason for encounter: follow-up evaluation.   Patient was originally scheduled today for a epidural steroid injection, left L4-L5 transforaminal however she states that she is finding benefit with Lyrica at her  current dose.  She is endorsing dry mouth and mild dizziness.  I do not recommend further dose escalation beyond this.  During very painful days, she can take 50 mg nightly for the time being I would like her to continue 25 mg 3 times daily.  Follow-up in the future as needed for left L4-L5 transforaminal ESI.  Spanish interpreter present for duration of visit.   HPI from initial clinic visit 07/01/2023: Spanish interpreter present.  Patient presents with a chief complaint of left-sided thoracic pain that radiates to her lateral thoracic region to her mid axillary line.  She states that this is been going on for over 3 months.  No inciting or traumatic event.  She has been evaluated by neurosurgery.  She is being referred here to consider thoracic spinal injections.  Of note she has done physical therapy in the past without any benefit.  Patient is not interested in repeating.  She has been utilizing Zanaflex to help with muscle spasms.  She has tried gabapentin in the past which was not effective.  She has not tried pregabalin.   Of note, she states that approximately 3 years ago she was seen at [facility] orthopedics for left low back and radiating left leg pain.  She states that she received a left L4-L5 transforaminal ESI at that time which did help out with her pain for approximately 3 months.  She confirms that her pain is higher up in her thoracic region and radiates out laterally.      ROS  Constitutional: Denies any fever or chills Gastrointestinal: No reported hemesis, hematochezia, vomiting, or acute GI distress Musculoskeletal:  Low back and left leg pain Neurological: No reported episodes  of acute onset apraxia, aphasia, dysarthria, agnosia, amnesia, paralysis, loss of coordination, or loss of consciousness  Medication Review  atorvastatin, levothyroxine, lisinopril, pregabalin, and tiZANidine  History Review  Allergy: Carmen Soto is allergic to tramadol. Drug: Carmen Soto   reports no history of drug use. Alcohol:  reports no history of alcohol use. Tobacco:  reports that she has never smoked. She has never used smokeless tobacco. Social: Carmen Soto  reports that she has never smoked. She has never used smokeless tobacco. She reports that she does not drink alcohol and does not use drugs. Medical:  has a past medical history of Arthritis, Back pain, Hypercholesteremia, and Hypertension. Surgical: Carmen Soto  has a past surgical history that includes Cesarean section; Foreign body removal esophageal (N/A, 01/28/2017); Abdominal hysterectomy; and Oophorectomy. Family: family history is not on file.  Laboratory Chemistry Profile   Renal Lab Results  Component Value Date   BUN 23 05/04/2023   CREATININE 0.73 05/04/2023   GFRAA >60 10/16/2019   GFRNONAA >60 05/04/2023    Hepatic Lab Results  Component Value Date   AST 42 (H) 05/04/2023   ALT 40 05/04/2023   ALBUMIN 3.5 05/04/2023   ALKPHOS 139 (H) 05/04/2023   LIPASE 29 10/16/2019    Electrolytes Lab Results  Component Value Date   NA 140 05/04/2023   K 3.8 05/04/2023   CL 108 05/04/2023   CALCIUM 9.3 05/04/2023    Bone No results found for: "VD25OH", "VD125OH2TOT", "JJ8841YS0", "YT0160FU9", "25OHVITD1", "25OHVITD2", "25OHVITD3", "TESTOFREE", "TESTOSTERONE"  Inflammation (CRP: Acute Phase) (ESR: Chronic Phase) No results found for: "CRP", "ESRSEDRATE", "LATICACIDVEN"       Note: Above Lab results reviewed.  Recent Imaging Review  DG Knee Complete 4 Views Right CLINICAL DATA:  Right posterior knee pain  EXAM: RIGHT KNEE - COMPLETE 4+ VIEW  COMPARISON:  None Available.  FINDINGS: No fracture or malalignment. Mild to moderate tricompartment arthritis of the knee with small effusion  IMPRESSION: Mild to moderate tricompartment arthritis with small effusion.  Electronically Signed   By: Jasmine Pang M.D.   On: 07/20/2023 21:01 Note: Reviewed        Physical Exam  General  appearance: Well nourished, well developed, and well hydrated. In no apparent acute distress Mental status: Alert, oriented x 3 (person, place, & time)       Respiratory: No evidence of acute respiratory distress Eyes: PERLA Vitals: BP (!) 145/78   Pulse 62   Temp (!) 97 F (36.1 C) (Temporal)   Resp 14   Ht 5\' 2"  (1.575 m)   Wt 148 lb (67.1 kg)   SpO2 100%   BMI 27.07 kg/m  BMI: Estimated body mass index is 27.07 kg/m as calculated from the following:   Height as of this encounter: 5\' 2"  (1.575 m).   Weight as of this encounter: 148 lb (67.1 kg). Ideal: Ideal body weight: 50.1 kg (110 lb 7.2 oz) Adjusted ideal body weight: 56.9 kg (125 lb 7.5 oz)  Assessment   Diagnosis Status  1. DISH (diffuse idiopathic skeletal hyperostosis)   2. Radicular pain of thoracic region (left)   3. Chronic pain syndrome    Controlled Controlled Controlled   Updated Problems: No problems updated.  Plan of Care  Follow-up as needed for right L4-L5 transforaminal ESI, continue Lyrica as prescribed. Carmen Soto has a current medication list which includes the following long-term medication(s): atorvastatin, levothyroxine, lisinopril, and pregabalin.  Pharmacotherapy (Medications Ordered): Meds ordered this  encounter  Medications   pregabalin (LYRICA) 25 MG capsule    Sig: Take 1 capsule (25 mg total) by mouth 3 (three) times daily.    Dispense:  90 capsule    Refill:  5   Orders:  No orders of the defined types were placed in this encounter.  Follow-up plan:   Return for PRN- for lumbar injection or MM, patient will call to schedule F2F appt prn.      Recent Visits Date Type Provider Dept  07/01/23 Office Visit Edward Jolly, MD [facility]  Showing recent visits within past 90 days and meeting all other requirements Today's Visits Date Type Provider Dept  07/28/23 Procedure visit Edward Jolly, MD [facility]  Showing today's visits and meeting  all other requirements Future Appointments No visits were found meeting these conditions. Showing future appointments within next 90 days and meeting all other requirements  I discussed the assessment and treatment plan with the patient. The patient was provided an opportunity to ask questions and all were answered. The patient agreed with the plan and demonstrated an understanding of the instructions.  Patient advised to call back or seek an in-person evaluation if the symptoms or condition worsens.  Duration of encounter: .  Total time on encounter, as per AMA guidelines included both the face-to-face and non-face-to-face time personally spent by the physician and/or other qualified health care professional(s) on the day of the encounter (includes time in activities that require the physician or other qualified health care professional and does not include time in activities normally performed by clinical staff). Physician's time may include the following activities when performed: Preparing to see the patient (e.g., pre-charting review of records, searching for previously ordered imaging, lab work, and nerve conduction tests) Review of prior analgesic pharmacotherapies. Reviewing PMP Interpreting ordered tests (e.g., lab work, imaging, nerve conduction tests) Performing post-procedure evaluations, including interpretation of diagnostic procedures Obtaining and/or reviewing separately obtained history Performing a medically appropriate examination and/or evaluation Counseling and educating the patient/family/caregiver Ordering medications, tests, or procedures Referring and communicating with other health care professionals (when not separately reported) Documenting clinical information in the electronic or other health record Independently interpreting results (not separately reported) and communicating results to the patient/ family/caregiver Care coordination (not separately  reported)  Note by: Edward Jolly, MD Date: 07/28/2023; Time: 10:04 AM

## 2023-07-31 ENCOUNTER — Encounter: Payer: Self-pay | Admitting: Orthopedic Surgery

## 2023-07-31 ENCOUNTER — Ambulatory Visit (INDEPENDENT_AMBULATORY_CARE_PROVIDER_SITE_OTHER): Payer: Self-pay | Admitting: Orthopedic Surgery

## 2023-07-31 VITALS — BP 132/78 | Ht 62.0 in | Wt 148.0 lb

## 2023-07-31 DIAGNOSIS — M4802 Spinal stenosis, cervical region: Secondary | ICD-10-CM

## 2023-07-31 DIAGNOSIS — M47814 Spondylosis without myelopathy or radiculopathy, thoracic region: Secondary | ICD-10-CM

## 2023-07-31 DIAGNOSIS — M47812 Spondylosis without myelopathy or radiculopathy, cervical region: Secondary | ICD-10-CM

## 2023-07-31 DIAGNOSIS — M50322 Other cervical disc degeneration at C5-C6 level: Secondary | ICD-10-CM

## 2023-07-31 DIAGNOSIS — M4722 Other spondylosis with radiculopathy, cervical region: Secondary | ICD-10-CM

## 2023-07-31 DIAGNOSIS — M5412 Radiculopathy, cervical region: Secondary | ICD-10-CM

## 2023-07-31 DIAGNOSIS — M25512 Pain in left shoulder: Secondary | ICD-10-CM

## 2023-11-10 ENCOUNTER — Telehealth: Payer: Self-pay | Admitting: Orthopedic Surgery

## 2023-11-10 NOTE — Telephone Encounter (Signed)
-----   Message from Patrisia C sent at 11/10/2023 10:34 AM EST ----- She said that she is not going pursue any further treatment at this time. She wants to catch up on her [facility] bills for right now. She will call back if she needs to. ----- Message ----- From: Drake Leach, PA-C Sent: 11/08/2023   8:05 PM EST To: Rockey Situ  She has f/u with me on Thursday for recheck after cervical injections with Lateef.   She has not see him back for her cervical spine. I don't see a f/u scheduled. Please call and help her schedule this f/u with Lateef and then push back her f/u with me 6-8 weeks. Will need interpreter.   Thanks!

## 2023-11-13 ENCOUNTER — Ambulatory Visit: Payer: Self-pay | Admitting: Orthopedic Surgery

## 2024-02-16 ENCOUNTER — Telehealth: Payer: Self-pay | Admitting: *Deleted

## 2024-02-17 ENCOUNTER — Encounter: Payer: Self-pay | Admitting: Family Medicine

## 2024-08-26 ENCOUNTER — Emergency Department: Payer: Self-pay

## 2024-08-26 ENCOUNTER — Emergency Department
Admission: EM | Admit: 2024-08-26 | Discharge: 2024-08-27 | Disposition: A | Discharge status: Home / Self Care | Payer: Self-pay | Attending: Emergency Medicine | Admitting: Emergency Medicine

## 2024-08-26 ENCOUNTER — Other Ambulatory Visit: Payer: Self-pay

## 2024-08-26 DIAGNOSIS — R519 Headache, unspecified: Secondary | ICD-10-CM

## 2024-08-26 DIAGNOSIS — I10 Essential (primary) hypertension: Secondary | ICD-10-CM | POA: Insufficient documentation

## 2024-08-26 DIAGNOSIS — Z79899 Other long term (current) drug therapy: Secondary | ICD-10-CM | POA: Insufficient documentation

## 2024-08-26 LAB — CBC WITH DIFFERENTIAL/PLATELET
Abs Immature Granulocytes: 0.02 K/uL (ref 0.00–0.07)
Basophils Absolute: 0 K/uL (ref 0.0–0.1)
Basophils Relative: 1 %
Eosinophils Absolute: 0.2 K/uL (ref 0.0–0.5)
Eosinophils Relative: 3 %
HCT: 39.4 % (ref 36.0–46.0)
Hemoglobin: 12.7 g/dL (ref 12.0–15.0)
Immature Granulocytes: 0 %
Lymphocytes Relative: 31 %
Lymphs Abs: 2.3 K/uL (ref 0.7–4.0)
MCH: 29.8 pg (ref 26.0–34.0)
MCHC: 32.2 g/dL (ref 30.0–36.0)
MCV: 92.5 fL (ref 80.0–100.0)
Monocytes Absolute: 0.6 K/uL (ref 0.1–1.0)
Monocytes Relative: 8 %
Neutro Abs: 4.2 K/uL (ref 1.7–7.7)
Neutrophils Relative %: 57 %
Platelets: 268 K/uL (ref 150–400)
RBC: 4.26 MIL/uL (ref 3.87–5.11)
RDW: 12.8 % (ref 11.5–15.5)
WBC: 7.3 K/uL (ref 4.0–10.5)
nRBC: 0 % (ref 0.0–0.2)

## 2024-08-26 LAB — BASIC METABOLIC PANEL WITH GFR
Anion gap: 11 (ref 5–15)
BUN: 18 mg/dL (ref 8–23)
CO2: 22 mmol/L (ref 22–32)
Calcium: 9.7 mg/dL (ref 8.9–10.3)
Chloride: 105 mmol/L (ref 98–111)
Creatinine, Ser: 0.64 mg/dL (ref 0.44–1.00)
GFR, Estimated: >60 mL/min (ref >60)
Glucose, Bld: 146 mg/dL — ABNORMAL HIGH (ref 70–99)
Potassium: 3.4 mmol/L — ABNORMAL LOW (ref 3.5–5.1)
Sodium: 138 mmol/L (ref 135–145)

## 2024-08-26 LAB — TROPONIN T, HIGH SENSITIVITY: Troponin T High Sensitivity: <15 ng/L (ref 0–19)

## 2024-08-26 MED ORDER — METOCLOPRAMIDE HCL 5 MG/ML IJ SOLN
10.0000 mg | Freq: Once | INTRAMUSCULAR | Status: AC
  Administered 2024-08-27: 10 mg via INTRAVENOUS
  Filled 2024-08-26: qty 2

## 2024-08-26 MED ORDER — DIPHENHYDRAMINE HCL 50 MG/ML IJ SOLN
25.0000 mg | Freq: Once | INTRAMUSCULAR | Status: AC
  Administered 2024-08-27: 25 mg via INTRAVENOUS
  Filled 2024-08-26: qty 1

## 2024-08-26 MED ORDER — SODIUM CHLORIDE 0.9 % IV BOLUS
1000.0000 mL | Freq: Once | INTRAVENOUS | Status: AC
  Administered 2024-08-27: 1000 mL via INTRAVENOUS

## 2024-08-26 MED ORDER — KETOROLAC TROMETHAMINE 30 MG/ML IJ SOLN
15.0000 mg | Freq: Once | INTRAMUSCULAR | Status: AC
  Administered 2024-08-27: 15 mg via INTRAVENOUS
  Filled 2024-08-26: qty 1

## 2024-08-26 NOTE — ED Triage Notes (Signed)
 Pt reports she had a headache for the past week, pt reports yesterday she noticed her BP was elevated. Pt reports she woke up today and noted her left sclera to be red and developed some dizziness.

## 2024-08-27 LAB — TROPONIN T, HIGH SENSITIVITY: Troponin T High Sensitivity: <15 ng/L (ref 0–19)

## 2024-08-27 NOTE — ED Provider Notes (Signed)
 [facility] Provider Note    Event Date/Time   First MD Initiated Contact with Patient 08/26/24 2334     (approximate)  History   Chief Complaint: Headache  HPI  Carmen Soto is a 68 y.o. female with a past medical history of arthritis, hypertension, hyperlipidemia, presents to the emergency department for a headache and high blood pressure.  According to the patient for the past week or so she has been experiencing intermittent headache as well as intermittent spikes in blood pressure over 180.  Patient takes lisinopril for her blood pressure and states normally it was well-controlled but it has not been more recently.  Patient states the other day she noticed a small bleed in her left eye she was concerned so she came to the emergency department today for evaluation.  Patient denies any weakness or numbness of any arm or leg confusion or slurred speech.  Spanish interpreter used for this evaluation.  Physical Exam   Triage Vital Signs: ED Triage Vitals  Encounter Vitals Group     BP 08/26/24 2005 (!) 155/93     Girls Systolic BP Percentile --      Girls Diastolic BP Percentile --      Boys Systolic BP Percentile --      Boys Diastolic BP Percentile --      Pulse Rate 08/26/24 2005 77     Resp 08/26/24 2005 16     Temp 08/26/24 2005 98.1 F (36.7 C)     Temp Source 08/26/24 2340 Oral     SpO2 08/26/24 2005 97 %     Weight 08/26/24 2008 148 lb (67.1 kg)     Height 08/26/24 2008 5' 2 (1.575 m)     Head Circumference --      Peak Flow --      Pain Score 08/26/24 2007 8     Pain Loc --      Pain Education --      Exclude from Growth Chart --     Most recent vital signs: Vitals:   08/26/24 2005 08/26/24 2340  BP: (!) 155/93 (!) 187/81  Pulse: 77 66  Resp: 16 18  Temp: 98.1 F (36.7 C) 98.2 F (36.8 C)  SpO2: 97% 100%    General: Awake, no distress.  CV:  Good peripheral perfusion.  Regular rate and rhythm  Resp:  Normal effort.   Equal breath sounds bilaterally.  Abd:  No distention.  Soft, nontender.  No rebound or guarding.  ED Results / Procedures / Treatments   EKG  EKG viewed and interpreted by myself shows a normal sinus rhythm at 73 bpm with a narrow QRS, normal axis, normal intervals, no concerning ST changes.  RADIOLOGY  I have reviewed and interpreted the CT head images.  No large bleed or significant abnormality seen on my evaluation. Radiology has read the CT scan as negative for acute abnormality.   MEDICATIONS ORDERED IN ED: Medications  ketorolac  (TORADOL ) 30 MG/ML injection 15 mg (has no administration in time range)  metoCLOPramide  (REGLAN ) injection 10 mg (has no administration in time range)  diphenhydrAMINE  (BENADRYL ) injection 25 mg (has no administration in time range)  sodium chloride  0.9 % bolus 1,000 mL (has no administration in time range)     IMPRESSION / MDM / ASSESSMENT AND PLAN / ED COURSE  I reviewed the triage vital signs and the nursing notes.  Patient's presentation is most consistent with acute presentation with potential threat to  life or bodily function.  Patient presents to the emergency department for headache and high blood pressure.  Overall patient appears well blood pressure is elevated currently 187/81 in the emergency department.  Lab work is reassuring with a normal CBC normal chemistry negative troponin.  CT scan of the head is negative.  Overall the patient appears well.  However given her ongoing headache x 1 week with high blood pressure I did offer to dose IV medication such as Toradol /Reglan /Benadryl  and fluids.  Patient is agreeable.  No concerning neurologic findings on exam or history.  We will reassess blood pressure once the patient's headache is controlled.  Patient states her headache has completely resolved and she is feeling much better.  Will discharge with outpatient follow-up.  Patient agreeable to plan of care.  FINAL CLINICAL IMPRESSION(S) / ED  DIAGNOSES   Headache Hypertension   Note:  This document was prepared using Dragon voice recognition software and may include unintentional dictation errors.   Dorothyann Drivers, MD 08/27/24 [phone]
